# Patient Record
Sex: Male | Born: 1940 | Race: Black or African American | Hispanic: No | Marital: Married | Smoking: Former smoker
Health system: Southern US, Community
[De-identification: ages and names within clinical notes are randomized; demographics above are authoritative.]

## PROBLEM LIST (undated history)

## (undated) DIAGNOSIS — R569 Unspecified convulsions: Secondary | ICD-10-CM

## (undated) DIAGNOSIS — R413 Other amnesia: Secondary | ICD-10-CM

## (undated) DIAGNOSIS — C61 Malignant neoplasm of prostate: Secondary | ICD-10-CM

## (undated) DIAGNOSIS — G4700 Insomnia, unspecified: Secondary | ICD-10-CM

## (undated) DIAGNOSIS — I639 Cerebral infarction, unspecified: Secondary | ICD-10-CM

## (undated) DIAGNOSIS — R269 Unspecified abnormalities of gait and mobility: Secondary | ICD-10-CM

## (undated) DIAGNOSIS — D496 Neoplasm of unspecified behavior of brain: Secondary | ICD-10-CM

## (undated) DIAGNOSIS — E7800 Pure hypercholesterolemia, unspecified: Secondary | ICD-10-CM

## (undated) DIAGNOSIS — C189 Malignant neoplasm of colon, unspecified: Secondary | ICD-10-CM

## (undated) DIAGNOSIS — F0390 Unspecified dementia without behavioral disturbance: Secondary | ICD-10-CM

## (undated) HISTORY — DX: Malignant neoplasm of prostate: C61

## (undated) HISTORY — PX: FOOT SURGERY: SHX648

## (undated) HISTORY — DX: Other amnesia: R41.3

## (undated) HISTORY — DX: Malignant neoplasm of colon, unspecified: C18.9

## (undated) HISTORY — PX: INGUINAL HERNIA REPAIR: SUR1180

## (undated) HISTORY — DX: Unspecified abnormalities of gait and mobility: R26.9

## (undated) HISTORY — DX: Unspecified dementia, unspecified severity, without behavioral disturbance, psychotic disturbance, mood disturbance, and anxiety: F03.90

## (undated) HISTORY — PX: OTHER SURGICAL HISTORY: SHX169

## (undated) HISTORY — DX: Cerebral infarction, unspecified: I63.9

## (undated) HISTORY — DX: Insomnia, unspecified: G47.00

## (undated) HISTORY — DX: Pure hypercholesterolemia, unspecified: E78.00

## (undated) HISTORY — DX: Unspecified convulsions: R56.9

---

## 2002-05-10 ENCOUNTER — Encounter: Payer: Self-pay | Admitting: Family Medicine

## 2002-05-10 ENCOUNTER — Ambulatory Visit (HOSPITAL_COMMUNITY): Admission: RE | Admit: 2002-05-10 | Discharge: 2002-05-10 | Payer: Self-pay | Admitting: Family Medicine

## 2002-05-28 ENCOUNTER — Encounter: Payer: Self-pay | Admitting: Family Medicine

## 2002-05-28 ENCOUNTER — Ambulatory Visit (HOSPITAL_COMMUNITY): Admission: RE | Admit: 2002-05-28 | Discharge: 2002-05-28 | Payer: Self-pay | Admitting: Family Medicine

## 2002-08-26 ENCOUNTER — Inpatient Hospital Stay (HOSPITAL_COMMUNITY): Admission: RE | Admit: 2002-08-26 | Discharge: 2002-08-30 | Payer: Self-pay | Admitting: Neurosurgery

## 2002-08-27 ENCOUNTER — Encounter: Payer: Self-pay | Admitting: Neurosurgery

## 2002-09-03 ENCOUNTER — Ambulatory Visit (HOSPITAL_COMMUNITY): Admission: RE | Admit: 2002-09-03 | Discharge: 2002-09-03 | Payer: Self-pay | Admitting: Neurosurgery

## 2002-09-03 ENCOUNTER — Encounter: Payer: Self-pay | Admitting: Neurosurgery

## 2002-09-15 ENCOUNTER — Encounter: Payer: Self-pay | Admitting: Neurosurgery

## 2002-09-15 ENCOUNTER — Ambulatory Visit (HOSPITAL_COMMUNITY): Admission: RE | Admit: 2002-09-15 | Discharge: 2002-09-15 | Payer: Self-pay | Admitting: Neurosurgery

## 2002-10-01 ENCOUNTER — Ambulatory Visit (HOSPITAL_COMMUNITY): Admission: RE | Admit: 2002-10-01 | Discharge: 2002-10-01 | Payer: Self-pay | Admitting: Neurosurgery

## 2002-10-01 ENCOUNTER — Encounter: Payer: Self-pay | Admitting: Neurosurgery

## 2002-10-21 ENCOUNTER — Encounter: Payer: Self-pay | Admitting: Neurosurgery

## 2002-10-21 ENCOUNTER — Ambulatory Visit (HOSPITAL_COMMUNITY): Admission: RE | Admit: 2002-10-21 | Discharge: 2002-10-21 | Payer: Self-pay | Admitting: Neurosurgery

## 2002-11-29 ENCOUNTER — Other Ambulatory Visit: Admission: RE | Admit: 2002-11-29 | Discharge: 2002-11-29 | Payer: Self-pay | Admitting: Urology

## 2003-01-11 ENCOUNTER — Ambulatory Visit (HOSPITAL_COMMUNITY): Admission: RE | Admit: 2003-01-11 | Discharge: 2003-01-11 | Payer: Self-pay | Admitting: Neurosurgery

## 2003-01-11 ENCOUNTER — Encounter: Payer: Self-pay | Admitting: Neurosurgery

## 2003-02-17 ENCOUNTER — Ambulatory Visit (HOSPITAL_COMMUNITY): Admission: RE | Admit: 2003-02-17 | Discharge: 2003-02-17 | Payer: Self-pay | Admitting: Internal Medicine

## 2003-02-28 ENCOUNTER — Inpatient Hospital Stay (HOSPITAL_COMMUNITY): Admission: AD | Admit: 2003-02-28 | Discharge: 2003-03-08 | Payer: Self-pay | Admitting: General Surgery

## 2003-04-08 ENCOUNTER — Encounter (HOSPITAL_COMMUNITY): Admission: RE | Admit: 2003-04-08 | Discharge: 2003-05-08 | Payer: Self-pay | Admitting: Oncology

## 2003-04-08 ENCOUNTER — Encounter: Admission: RE | Admit: 2003-04-08 | Discharge: 2003-04-08 | Payer: Self-pay | Admitting: Oncology

## 2003-12-19 ENCOUNTER — Encounter (HOSPITAL_COMMUNITY): Admission: RE | Admit: 2003-12-19 | Discharge: 2004-01-18 | Payer: Self-pay | Admitting: Oncology

## 2003-12-19 ENCOUNTER — Encounter: Admission: RE | Admit: 2003-12-19 | Discharge: 2003-12-19 | Payer: Self-pay | Admitting: Oncology

## 2004-01-17 ENCOUNTER — Ambulatory Visit (HOSPITAL_COMMUNITY): Admission: RE | Admit: 2004-01-17 | Discharge: 2004-01-17 | Payer: Self-pay | Admitting: Neurosurgery

## 2004-06-28 ENCOUNTER — Encounter: Admission: RE | Admit: 2004-06-28 | Discharge: 2004-06-28 | Payer: Self-pay | Admitting: Oncology

## 2004-06-28 ENCOUNTER — Ambulatory Visit (HOSPITAL_COMMUNITY): Payer: Self-pay | Admitting: Oncology

## 2004-06-28 ENCOUNTER — Encounter (HOSPITAL_COMMUNITY): Admission: RE | Admit: 2004-06-28 | Discharge: 2004-07-28 | Payer: Self-pay | Admitting: Oncology

## 2004-11-13 ENCOUNTER — Ambulatory Visit (HOSPITAL_COMMUNITY): Admission: RE | Admit: 2004-11-13 | Discharge: 2004-11-13 | Payer: Self-pay | Admitting: Family Medicine

## 2005-01-08 ENCOUNTER — Encounter (HOSPITAL_COMMUNITY): Admission: RE | Admit: 2005-01-08 | Discharge: 2005-02-02 | Payer: Self-pay

## 2005-01-08 ENCOUNTER — Ambulatory Visit (HOSPITAL_COMMUNITY): Payer: Self-pay | Admitting: Oncology

## 2005-01-08 ENCOUNTER — Encounter: Admission: RE | Admit: 2005-01-08 | Discharge: 2005-02-02 | Payer: Self-pay | Admitting: Oncology

## 2005-04-19 ENCOUNTER — Ambulatory Visit (HOSPITAL_COMMUNITY): Payer: Self-pay | Admitting: Oncology

## 2005-04-19 ENCOUNTER — Encounter: Admission: RE | Admit: 2005-04-19 | Discharge: 2005-04-19 | Payer: Self-pay | Admitting: Oncology

## 2005-04-19 ENCOUNTER — Encounter (HOSPITAL_COMMUNITY): Admission: RE | Admit: 2005-04-19 | Discharge: 2005-04-19 | Payer: Self-pay | Admitting: Oncology

## 2005-07-17 ENCOUNTER — Encounter: Admission: RE | Admit: 2005-07-17 | Discharge: 2005-07-17 | Payer: Self-pay | Admitting: Oncology

## 2005-07-17 ENCOUNTER — Ambulatory Visit (HOSPITAL_COMMUNITY): Payer: Self-pay | Admitting: Oncology

## 2005-07-17 ENCOUNTER — Encounter (HOSPITAL_COMMUNITY): Admission: RE | Admit: 2005-07-17 | Discharge: 2005-08-16 | Payer: Self-pay | Admitting: Oncology

## 2005-08-30 ENCOUNTER — Ambulatory Visit (HOSPITAL_COMMUNITY): Admission: RE | Admit: 2005-08-30 | Discharge: 2005-08-30 | Payer: Self-pay | Admitting: Neurosurgery

## 2005-09-18 ENCOUNTER — Ambulatory Visit (HOSPITAL_COMMUNITY): Admission: RE | Admit: 2005-09-18 | Discharge: 2005-09-18 | Payer: Self-pay | Admitting: Internal Medicine

## 2005-09-18 ENCOUNTER — Ambulatory Visit: Payer: Self-pay | Admitting: Internal Medicine

## 2005-09-18 ENCOUNTER — Encounter (INDEPENDENT_AMBULATORY_CARE_PROVIDER_SITE_OTHER): Payer: Self-pay | Admitting: *Deleted

## 2005-10-16 ENCOUNTER — Ambulatory Visit (HOSPITAL_COMMUNITY): Admission: RE | Admit: 2005-10-16 | Discharge: 2005-10-16 | Payer: Self-pay | Admitting: Family Medicine

## 2005-11-01 ENCOUNTER — Ambulatory Visit (HOSPITAL_COMMUNITY): Admission: RE | Admit: 2005-11-01 | Discharge: 2005-11-01 | Payer: Self-pay | Admitting: Neurosurgery

## 2005-11-05 ENCOUNTER — Encounter: Admission: RE | Admit: 2005-11-05 | Discharge: 2005-11-05 | Payer: Self-pay | Admitting: Oncology

## 2005-11-05 ENCOUNTER — Encounter (HOSPITAL_COMMUNITY): Admission: RE | Admit: 2005-11-05 | Discharge: 2005-12-05 | Payer: Self-pay | Admitting: Oncology

## 2005-11-05 ENCOUNTER — Ambulatory Visit (HOSPITAL_COMMUNITY): Payer: Self-pay | Admitting: Oncology

## 2006-01-29 ENCOUNTER — Ambulatory Visit (HOSPITAL_COMMUNITY): Payer: Self-pay | Admitting: Oncology

## 2006-01-29 ENCOUNTER — Encounter (HOSPITAL_COMMUNITY): Admission: RE | Admit: 2006-01-29 | Discharge: 2006-02-01 | Payer: Self-pay | Admitting: Oncology

## 2006-01-29 ENCOUNTER — Encounter: Admission: RE | Admit: 2006-01-29 | Discharge: 2006-02-01 | Payer: Self-pay | Admitting: Oncology

## 2006-04-22 ENCOUNTER — Encounter (HOSPITAL_COMMUNITY): Admission: RE | Admit: 2006-04-22 | Discharge: 2006-05-05 | Payer: Self-pay | Admitting: Oncology

## 2006-04-22 ENCOUNTER — Ambulatory Visit (HOSPITAL_COMMUNITY): Payer: Self-pay | Admitting: Oncology

## 2006-04-25 ENCOUNTER — Ambulatory Visit (HOSPITAL_COMMUNITY): Admission: RE | Admit: 2006-04-25 | Discharge: 2006-04-25 | Payer: Self-pay | Admitting: Neurosurgery

## 2006-06-18 ENCOUNTER — Ambulatory Visit: Payer: Self-pay | Admitting: Gastroenterology

## 2006-08-29 ENCOUNTER — Ambulatory Visit (HOSPITAL_COMMUNITY): Admission: RE | Admit: 2006-08-29 | Discharge: 2006-08-29 | Payer: Self-pay | Admitting: General Surgery

## 2006-10-23 ENCOUNTER — Emergency Department (HOSPITAL_COMMUNITY): Admission: EM | Admit: 2006-10-23 | Discharge: 2006-10-23 | Payer: Self-pay | Admitting: Emergency Medicine

## 2006-10-24 ENCOUNTER — Inpatient Hospital Stay (HOSPITAL_COMMUNITY): Admission: RE | Admit: 2006-10-24 | Discharge: 2006-10-27 | Payer: Self-pay | Admitting: Orthopaedic Surgery

## 2006-11-03 ENCOUNTER — Ambulatory Visit: Payer: Self-pay | Admitting: Orthopedic Surgery

## 2006-11-10 ENCOUNTER — Ambulatory Visit (HOSPITAL_COMMUNITY): Payer: Self-pay | Admitting: Oncology

## 2006-11-10 ENCOUNTER — Encounter (HOSPITAL_COMMUNITY): Admission: RE | Admit: 2006-11-10 | Discharge: 2006-12-10 | Payer: Self-pay | Admitting: Oncology

## 2006-11-20 ENCOUNTER — Encounter (HOSPITAL_COMMUNITY): Admission: RE | Admit: 2006-11-20 | Discharge: 2006-12-20 | Payer: Self-pay | Admitting: Orthopaedic Surgery

## 2006-12-23 ENCOUNTER — Encounter (HOSPITAL_COMMUNITY): Admission: RE | Admit: 2006-12-23 | Discharge: 2007-01-22 | Payer: Self-pay | Admitting: Orthopaedic Surgery

## 2007-01-12 ENCOUNTER — Ambulatory Visit (HOSPITAL_COMMUNITY): Admission: RE | Admit: 2007-01-12 | Discharge: 2007-01-12 | Payer: Self-pay | Admitting: Family Medicine

## 2007-01-23 ENCOUNTER — Encounter (HOSPITAL_COMMUNITY): Admission: RE | Admit: 2007-01-23 | Discharge: 2007-02-03 | Payer: Self-pay | Admitting: Orthopaedic Surgery

## 2007-03-09 IMAGING — CT CT HEAD W/O CM
1 series · 16 of 28 positions shown, 20 images · non-contrast
Comparison: MR of 08/30/05.

CLINICAL DATA: Reevaluate hydrocephalus.  
 HEAD CT WITHOUT CONTRAST ? 11/01/05:
TECHNIQUE: Contiguous axial CT images were obtained from the base of the skull through the vertex according to standard protocol without contrast.

[Series 2: brain · axial · 0.47mm/px · z∈[+174,+303]mm · 16 of 28 slices shown, 20 images]
[im 2/28  brain]
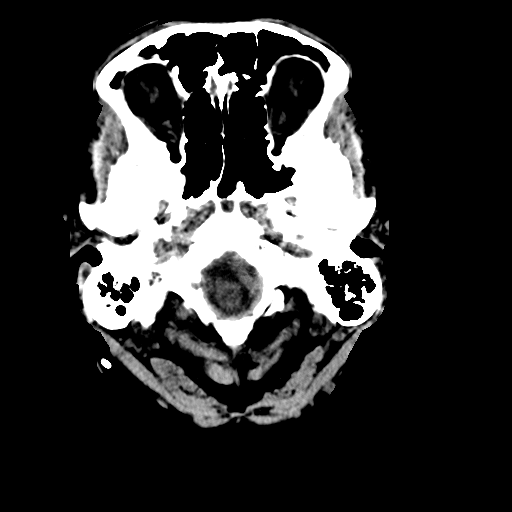
[im 2/28  bone]
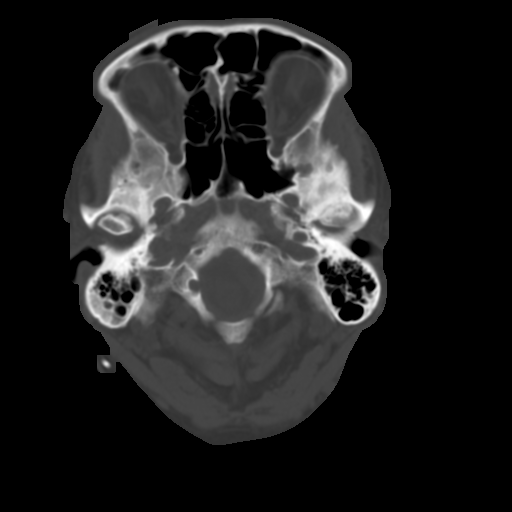
[im 4/28  brain]
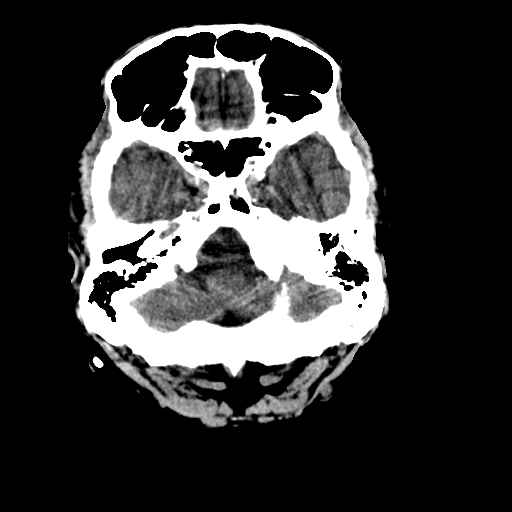
[im 6/28  brain]
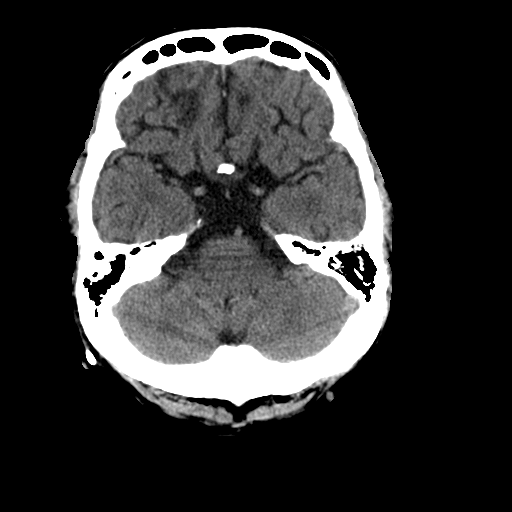
[im 7/28  brain]
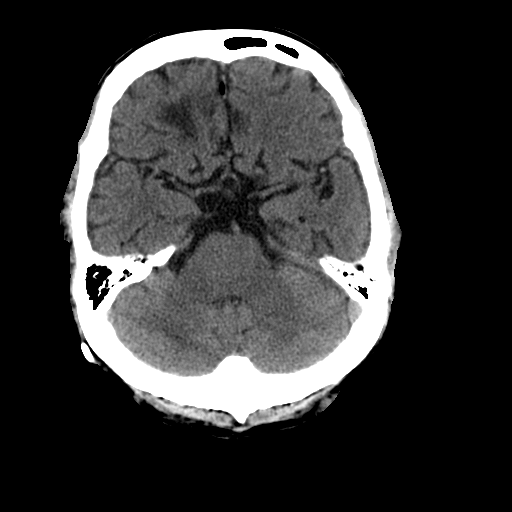
[im 9/28  brain]
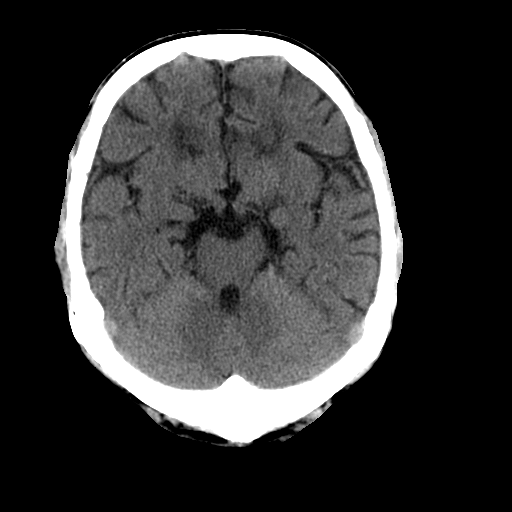
[im 9/28  bone]
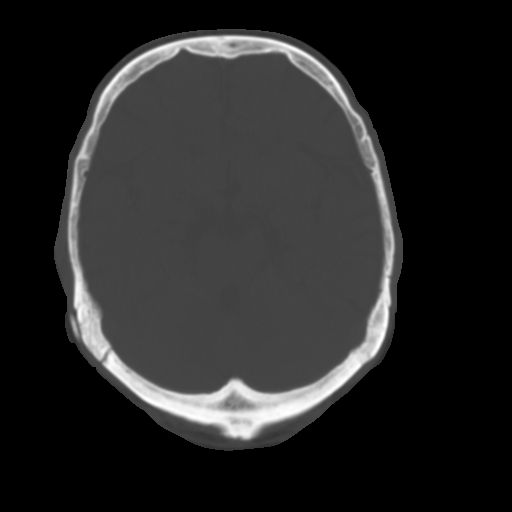
[im 10/28  brain]
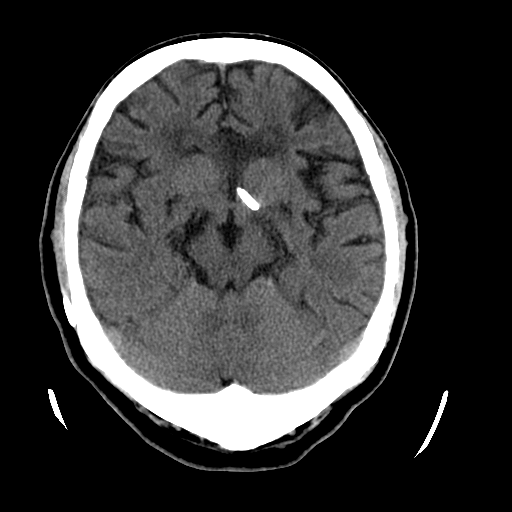
[im 12/28  brain]
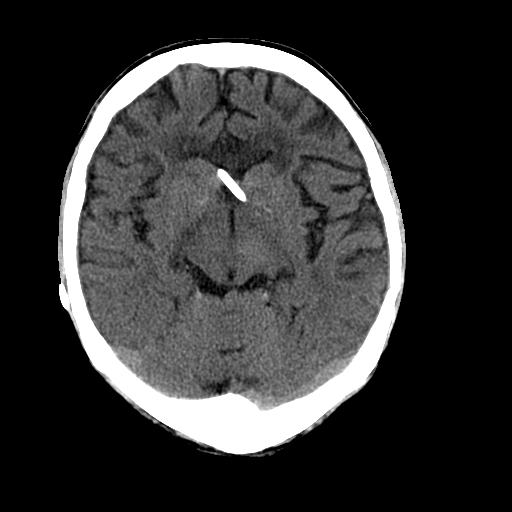
[im 14/28  brain]
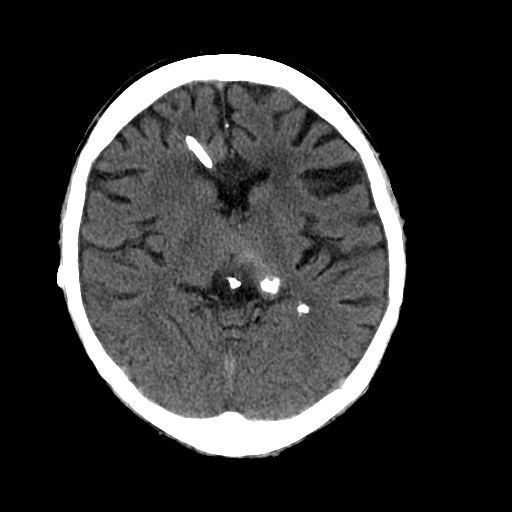
[im 15/28  brain]
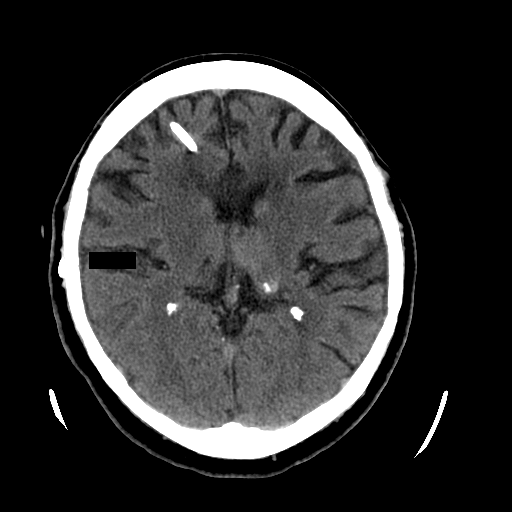
[im 15/28  bone]
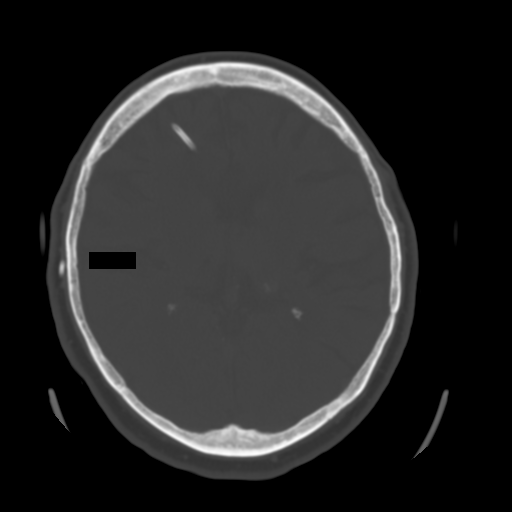
[im 17/28  brain]
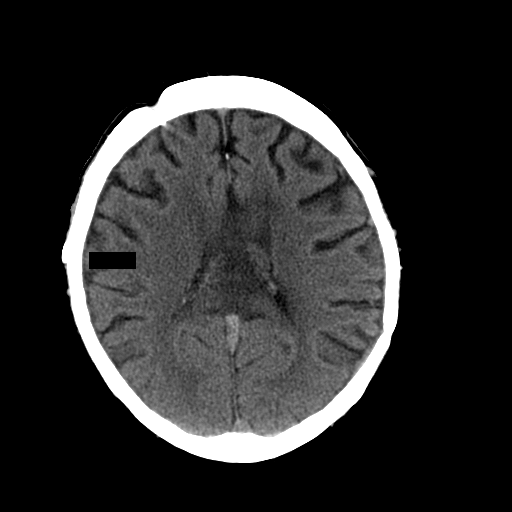
[im 19/28  brain]
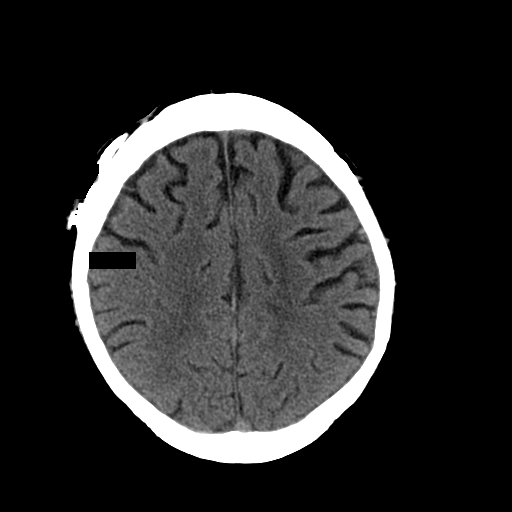
[im 20/28  brain]
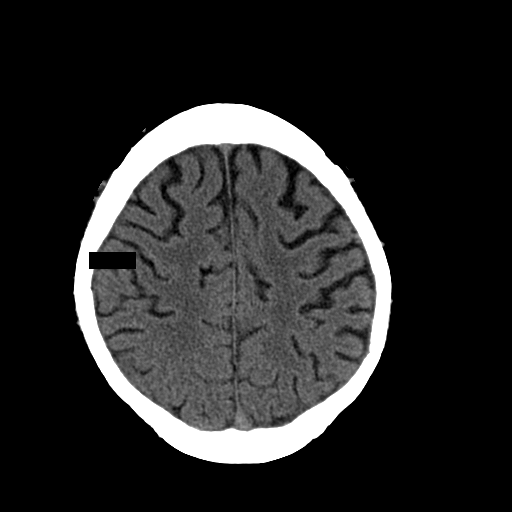
[im 22/28  brain]
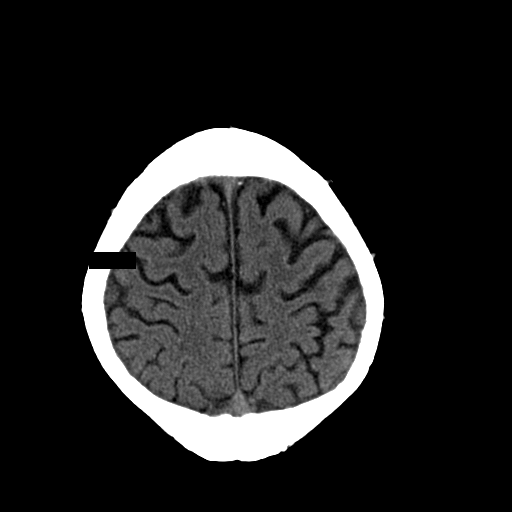
[im 22/28  bone]
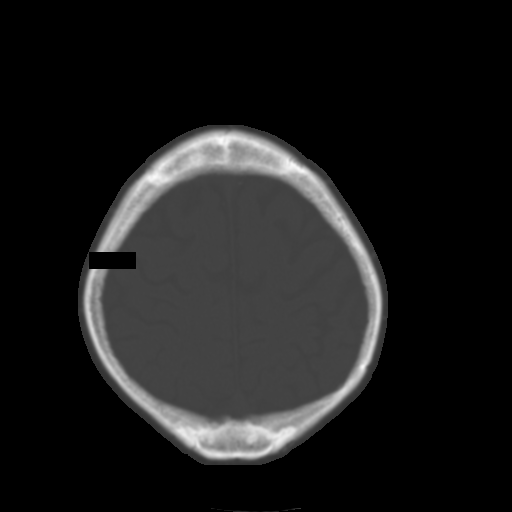
[im 23/28  brain]
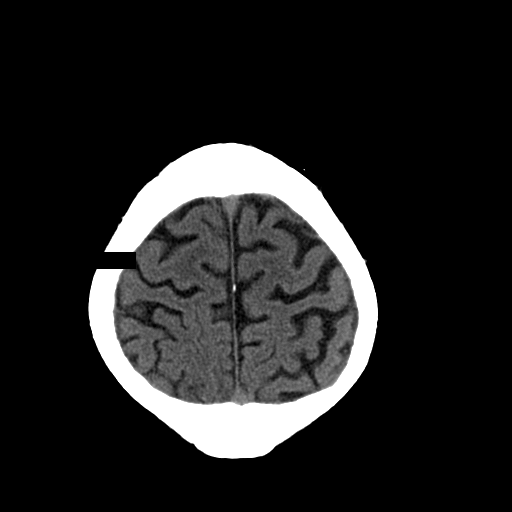
[im 25/28  brain]
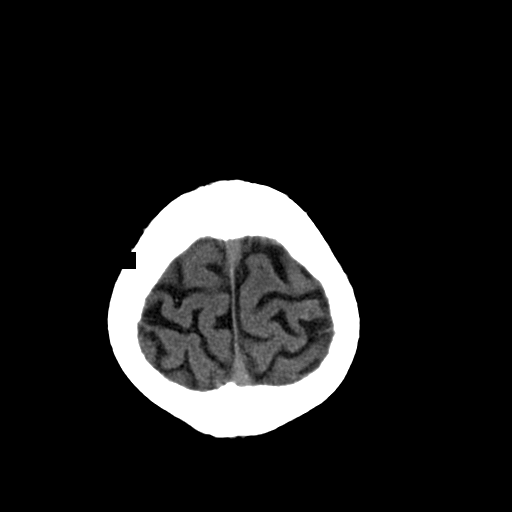
[im 27/28  brain]
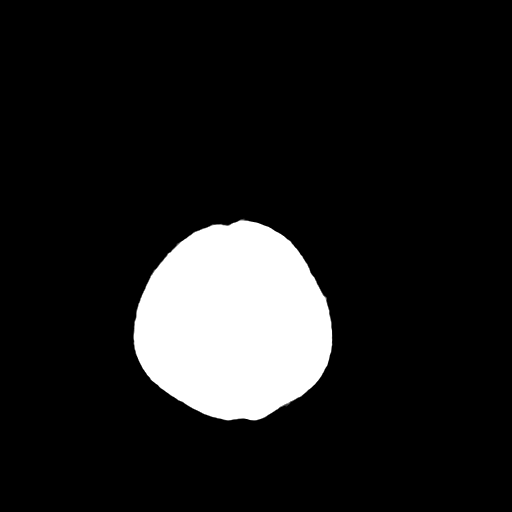

[16 of 28 positions shown; findings below may reference images not displayed]

FINDINGS: A ventricular shunt introduced from a right frontal approach appears to enter the frontal horns of the lateral ventricle and then pass into the inferior basal ganglia region.  Ventricular size shows continued good decompression. Chronic calcification in the left thalamus and cerebral peduncle remains evident.  No sign of increasing mass effect in that region. There continues to be fullness in the region of the genu of the corpus callosum with some low density in the adjacent white matter.
 No extra-axial collection.  No new findings.
IMPRESSION: 1.  Continued good decompression of the ventricles.
 2.  Persistent calcification in the left thalamus and cerebral peduncle and persistent low density throughout the corpus callosum, particular at the genu region.

## 2007-05-13 ENCOUNTER — Ambulatory Visit (HOSPITAL_COMMUNITY): Payer: Self-pay | Admitting: Oncology

## 2007-05-13 ENCOUNTER — Encounter (HOSPITAL_COMMUNITY): Admission: RE | Admit: 2007-05-13 | Discharge: 2007-06-12 | Payer: Self-pay | Admitting: Oncology

## 2007-05-19 ENCOUNTER — Ambulatory Visit (HOSPITAL_COMMUNITY): Admission: RE | Admit: 2007-05-19 | Discharge: 2007-05-19 | Payer: Self-pay | Admitting: Oncology

## 2007-08-05 ENCOUNTER — Encounter (HOSPITAL_COMMUNITY): Admission: RE | Admit: 2007-08-05 | Discharge: 2007-09-04 | Payer: Self-pay | Admitting: Oncology

## 2007-08-05 ENCOUNTER — Ambulatory Visit (HOSPITAL_COMMUNITY): Payer: Self-pay | Admitting: Oncology

## 2007-11-04 ENCOUNTER — Encounter (HOSPITAL_COMMUNITY): Admission: RE | Admit: 2007-11-04 | Discharge: 2007-12-04 | Payer: Self-pay | Admitting: Oncology

## 2007-11-04 ENCOUNTER — Ambulatory Visit (HOSPITAL_COMMUNITY): Payer: Self-pay | Admitting: Oncology

## 2007-12-21 ENCOUNTER — Ambulatory Visit (HOSPITAL_COMMUNITY): Admission: RE | Admit: 2007-12-21 | Discharge: 2007-12-21 | Payer: Self-pay | Admitting: Family Medicine

## 2008-02-10 ENCOUNTER — Encounter (HOSPITAL_COMMUNITY): Admission: RE | Admit: 2008-02-10 | Discharge: 2008-03-11 | Payer: Self-pay | Admitting: Oncology

## 2008-02-10 ENCOUNTER — Ambulatory Visit (HOSPITAL_COMMUNITY): Payer: Self-pay | Admitting: Oncology

## 2008-02-12 ENCOUNTER — Ambulatory Visit (HOSPITAL_COMMUNITY): Admission: RE | Admit: 2008-02-12 | Discharge: 2008-02-12 | Payer: Self-pay | Admitting: Neurosurgery

## 2008-05-11 ENCOUNTER — Encounter (HOSPITAL_COMMUNITY): Admission: RE | Admit: 2008-05-11 | Discharge: 2008-06-10 | Payer: Self-pay | Admitting: Oncology

## 2008-05-11 ENCOUNTER — Ambulatory Visit (HOSPITAL_COMMUNITY): Payer: Self-pay | Admitting: Oncology

## 2008-09-07 ENCOUNTER — Encounter (HOSPITAL_COMMUNITY): Admission: RE | Admit: 2008-09-07 | Discharge: 2008-10-07 | Payer: Self-pay | Admitting: Oncology

## 2008-09-07 ENCOUNTER — Ambulatory Visit (HOSPITAL_COMMUNITY): Payer: Self-pay | Admitting: Oncology

## 2008-11-04 ENCOUNTER — Ambulatory Visit (HOSPITAL_COMMUNITY): Admission: RE | Admit: 2008-11-04 | Discharge: 2008-11-04 | Payer: Self-pay | Admitting: Family Medicine

## 2009-01-11 ENCOUNTER — Ambulatory Visit (HOSPITAL_COMMUNITY): Payer: Self-pay | Admitting: Oncology

## 2009-01-11 ENCOUNTER — Encounter (HOSPITAL_COMMUNITY): Admission: RE | Admit: 2009-01-11 | Discharge: 2009-02-01 | Payer: Self-pay | Admitting: Oncology

## 2009-04-19 ENCOUNTER — Encounter (HOSPITAL_COMMUNITY): Admission: RE | Admit: 2009-04-19 | Discharge: 2009-05-02 | Payer: Self-pay | Admitting: Oncology

## 2009-04-19 ENCOUNTER — Ambulatory Visit (HOSPITAL_COMMUNITY): Payer: Self-pay | Admitting: Oncology

## 2009-08-23 ENCOUNTER — Ambulatory Visit (HOSPITAL_COMMUNITY): Payer: Self-pay | Admitting: Oncology

## 2009-08-23 ENCOUNTER — Encounter (HOSPITAL_COMMUNITY): Admission: RE | Admit: 2009-08-23 | Discharge: 2009-09-22 | Payer: Self-pay | Admitting: Oncology

## 2009-10-30 ENCOUNTER — Ambulatory Visit (HOSPITAL_COMMUNITY): Admission: RE | Admit: 2009-10-30 | Discharge: 2009-10-30 | Payer: Self-pay | Admitting: Neurology

## 2009-12-27 ENCOUNTER — Encounter (HOSPITAL_COMMUNITY): Admission: RE | Admit: 2009-12-27 | Discharge: 2010-01-26 | Payer: Self-pay | Admitting: Oncology

## 2009-12-27 ENCOUNTER — Ambulatory Visit (HOSPITAL_COMMUNITY): Payer: Self-pay | Admitting: Oncology

## 2010-04-25 ENCOUNTER — Ambulatory Visit (HOSPITAL_COMMUNITY): Payer: Self-pay | Admitting: Oncology

## 2010-05-09 ENCOUNTER — Ambulatory Visit (HOSPITAL_COMMUNITY): Admit: 2010-05-09 | Payer: Self-pay | Admitting: Oncology

## 2010-05-14 ENCOUNTER — Encounter (HOSPITAL_COMMUNITY): Admission: RE | Admit: 2010-05-14 | Payer: Self-pay | Source: Home / Self Care | Admitting: Oncology

## 2010-07-19 LAB — CEA: CEA: 1.3 ng/mL (ref 0.0–5.0)

## 2010-07-24 LAB — CEA: CEA: 1.4 ng/mL (ref 0.0–5.0)

## 2010-08-07 LAB — CBC
HCT: 40.4 % (ref 39.0–52.0)
Hemoglobin: 13.6 g/dL (ref 13.0–17.0)
MCHC: 33.7 g/dL (ref 30.0–36.0)
MCV: 90 fL (ref 78.0–100.0)
Platelets: 222 10*3/uL (ref 150–400)
RBC: 4.48 MIL/uL (ref 4.22–5.81)
RDW: 14.5 % (ref 11.5–15.5)
WBC: 4.9 10*3/uL (ref 4.0–10.5)

## 2010-08-07 LAB — COMPREHENSIVE METABOLIC PANEL
ALT: 11 U/L (ref 0–53)
AST: 16 U/L (ref 0–37)
Albumin: 3.7 g/dL (ref 3.5–5.2)
Alkaline Phosphatase: 166 U/L — ABNORMAL HIGH (ref 39–117)
BUN: 4 mg/dL — ABNORMAL LOW (ref 6–23)
CO2: 28 mEq/L (ref 19–32)
Calcium: 8.8 mg/dL (ref 8.4–10.5)
Chloride: 104 mEq/L (ref 96–112)
Creatinine, Ser: 0.7 mg/dL (ref 0.4–1.5)
GFR calc Af Amer: >60 mL/min (ref >60)
GFR calc non Af Amer: >60 mL/min (ref >60)
Glucose, Bld: 92 mg/dL (ref 70–99)
Potassium: 3.6 mEq/L (ref 3.5–5.1)
Sodium: 140 mEq/L (ref 135–145)
Total Bilirubin: 0.5 mg/dL (ref 0.3–1.2)
Total Protein: 6.5 g/dL (ref 6.0–8.3)

## 2010-08-07 LAB — PSA: PSA: 3.23 ng/mL (ref 0.10–4.00)

## 2010-08-07 LAB — CEA: CEA: 0.9 ng/mL (ref 0.0–5.0)

## 2010-08-07 LAB — DIFFERENTIAL
Lymphocytes Relative: 42 % (ref 12–46)
Lymphs Abs: 2.1 10*3/uL (ref 0.7–4.0)
Neutrophils Relative %: 44 % (ref 43–77)

## 2010-08-10 LAB — CEA: CEA: 0.7 ng/mL (ref 0.0–5.0)

## 2010-08-14 LAB — CEA: CEA: <0.5 ng/mL (ref 0.0–5.0)

## 2010-08-14 LAB — PSA: PSA: 9.59 ng/mL — ABNORMAL HIGH (ref 0.10–4.00)

## 2010-08-20 LAB — CBC
HCT: 43.5 % (ref 39.0–52.0)
MCHC: 33.1 g/dL (ref 30.0–36.0)
Platelets: 220 10*3/uL (ref 150–400)
RDW: 14.4 % (ref 11.5–15.5)

## 2010-08-20 LAB — COMPREHENSIVE METABOLIC PANEL
ALT: 17 U/L (ref 0–53)
Calcium: 9 mg/dL (ref 8.4–10.5)
Creatinine, Ser: 0.77 mg/dL (ref 0.4–1.5)
GFR calc Af Amer: >60 mL/min (ref >60)
Glucose, Bld: 91 mg/dL (ref 70–99)
Sodium: 138 mEq/L (ref 135–145)
Total Protein: 7.2 g/dL (ref 6.0–8.3)

## 2010-08-20 LAB — FREE PSA
PSA, Free Pct: 8 % — ABNORMAL LOW (ref >25)
PSA, Free: 0.7 ng/mL

## 2010-08-20 LAB — PSA: PSA: 8.59 ng/mL — ABNORMAL HIGH (ref 0.10–4.00)

## 2010-09-18 NOTE — Discharge Summary (Signed)
[name], Jacob Wagner             ACCOUNT NO.:  [id number]   MEDICAL RECORD NO.:  [id number]          PATIENT TYPE:  INP   LOCATION:  A327                          FACILITY:  [facility]   PHYSICIAN:  J. Darreld Mclean, M.D. DATE OF BIRTH:  07/30/1940   DATE OF ADMISSION:  10/24/2006  DATE OF DISCHARGE:  LH                               DISCHARGE SUMMARY   DISCHARGE DIAGNOSIS:  Trimalleolar fracture of the left ankle.   PROCEDURE PERFORMED:  Open treatment, internal reduction of the left  ankle fracture.   DISCHARGE STATUS:  Improved.   PROGNOSIS:  Good.   DISPOSITION:  Home, to be seen by home health physical therapy.  Toe-  touch weightbearing on the left.   The patient will be seen in my office on November 10, 2006, with x-rays at  that time out of plaster, to remove the staples and put a new cast on.   The patient used a platform walker at home.   DISCHARGE MEDICATIONS:  1. Darvocet-N 100 one every 6 hours p.r.n. pain.  2. Xanax 1 mg p.o. b.i.d.  3. Dilantin 100 mg two in the evening, one in the morning daily.  4. Excedrin one daily.  5. Tylenol as needed.  6. Flexeril 10 mg as needed.   The patient fell and injured his ankle the day before admission.  I saw  him at the office the following day.  He was seen in the emergency room  the day of the injury.   I saw him in the office.  It was obvious he had a displaced fracture and  I recommended surgery.  We originally planned surgery the following day  but he was seen by the anesthesiologist for his preop and felt that it  could be done later that day and be done safely; therefore, he had his  surgery on Friday, the day of admission.  He tolerated it well.  Saturday he was afebrile.  His pain was controlled with a PCA pump.  He  was able to get up out of the bed and make slow progress.  On Saturday  it was obvious that he would need a platform walker.  This was given and  he did much  better.  Family does not want him to go to a  nursing home.  They want to  have home health.  This was arranged.  The patient had physical therapy  prior to discharge.  He is doing well.  He is afebrile, his pain  controlled.   I will see him in the office as stated.  __________ he is to contact me  through the office hospital beeper system.                                            ______________________________  Shela Commons. Darreld Mclean, M.D.     JWK/MEDQ  D:  10/27/2006  T:  10/27/2006  Job:  [id number]

## 2010-09-18 NOTE — Consult Note (Signed)
NAMEALY, Jacob Wagner NO.:  [id number]   MEDICAL RECORD NO.:  [id number]          PATIENT TYPE:  INP   LOCATION:  A327                          FACILITY:  [facility]   PHYSICIAN:  Marcello Moores, MD   DATE OF BIRTH:  [birth year]   DATE OF CONSULTATION:  DATE OF DISCHARGE:                                 CONSULTATION   He was admitted by Dr. Hilda Lias for left ankle fracture for repair.   REASON FOR CONSULTATION:  Medical evaluation and follow-up.   HISTORY OF PRESENT ILLNESS:  Jacob Wagner is a 70 year old African-  American male patient who happened to have left ankle injury yesterday  and had left malleolar fracture and was admitted today and had surgery  by Dr. Hilda Lias and repair, and he is admitted to the third floor.  Currently, he has not any complaints except discomfort on his left  ankle.  Otherwise, he has not any shortness of breath or chest  complaints.  No GI complaints.  No urinary complaints.  He stated that  he is taking at home only Dilantin, Xanax and aspirin.  Otherwise, he is  not taking any other medications, and he denied any history of COPD,  asthma.  He denied history of any diabetes mellitus.  He denied any  history of hypertension or any heart problems.   REVIEW OF SYSTEMS:  A 10-point review of systems is noncontributory  except he had injured yesterday left ankle with fracture ankle  spontaneous.   ALLERGIES:  No known drug allergies.   PAST MEDICAL HISTORY:  Questionable history of seizure.  The patient  mentioned history of seizure 20 years back but I assume that Dilantin  was taken for his brain tumor rather than 20 years' history of seizure.   PAST SURGICAL HISTORY:  1. 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  2. History of colon cancer and prostate cancer status post surgery 3      or 4 years ago as per the patient.  3. Mole removal 5 years ago.  4. Hernia repair few months ago.  5. 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   HOME MEDICATIONS:  1. Dilantin 100 mg in the morning and 200 mg at bedtime.  2. Xanax 1 mg 2 times a day.  3. Excedrin - aspirin.   SOCIAL HISTORY:  He is married and living in [place], and he denied  any smoking, and he denied alcohol or drug abuse.   PHYSICAL EXAMINATION:  He is lying on the bed comfortably.  VITAL SIGNS:  Blood pressure is 123/70 and temperature is 97.8, pulse is  59 and respiratory rate is 16, saturation is 98%.  HEENT:  He has pink conjunctivae.  Nonicteric sclera.  There is surgical  scar on the right parietal area.  On the chest, he has good air entry bilaterally.  CARDIOVASCULAR SYSTEM:  S1-S2 well heard, regular.  ABDOMEN:  Soft.  No area of tenderness.  There is old surgical scar.  EXTREMITIES:  He has not any pitting edema on the right side, and left  ankle area is dressed including the lower leg.  CENTRAL NERVOUS SYSTEM:  He is alert and oriented, and there is not any  sign of neurological deficit.   On the hematology, white blood cells 7, hemoglobin is 13.9, hematocrit  is 40, platelet count is 241.  On the chemistry, sodium is 143,  potassium is 3.4, chloride is 102 and bicarb is 37, glucose is 86, BUN 8  and creatinine 0.4.  Alkaline phosphatase is 165, slightly elevated, and  otherwise, the liver function test is within normal range.  Urinalysis  is basically negative.   ASSESSMENT:  1. Left ankle injury with malleolar fracture status post surgery, and      he is getting morphine for pain, and he is comfortable with that.  2. Benign brain tumor status post shunt which was done 4 years ago,      and he has follow-up and probably is getting Dilantin in relation      with that to prevent seizures, and he was already started at his      home dose by Dr. Hilda Lias and will continue with that, and we will      follow the patient as long as he is staying in the hospital and if      there is any problem also will manage it  accordingly.   Thank you for involving [place] in his management.      Marcello Moores, MD  Electronically Signed     MT/MEDQ  D:  10/24/2006  T:  10/25/2006  Job:  [id number]

## 2010-09-18 NOTE — Op Note (Signed)
Jacob Wagner, Jacob Wagner             ACCOUNT NO.:  [id number]   MEDICAL RECORD NO.:  [id number]          PATIENT TYPE:  INP   LOCATION:  A327                          FACILITY:  [facility]   PHYSICIAN:  J. Darreld Mclean, M.D. DATE OF BIRTH:  1940-09-12   DATE OF PROCEDURE:  DATE OF DISCHARGE:                               OPERATIVE REPORT   PREOPERATIVE DIAGNOSIS:  Trimalleolar fracture of the left ankle.   POSTOPERATIVE DIAGNOSIS:  Trimalleolar fracture of the left ankle.   PROCEDURE:  Open treatment and internal reduction of left trimalleolar  fracture of the ankle, medial malleolus and lateral malleolus.   ANESTHESIA:  Spinal.   TOURNIQUET TIME:  Thirty-nine minutes.   DRAINS:  No drains.   POSTERIOR SPLINT:  Applied.   SURGEON:  J. Darreld Mclean, M.D.   INDICATIONS:  The patient is a 70 year old male who fell in his yard  yesterday and hurt his left ankle and seen in the emergency room.  X-  rays show the fracture.  He was put in a posterior splint and the ER  contacted me and said they would send him to the office this morning.  I  saw him this morning.  I looked at the x-rays and he has displacement.  I took the splint off.  He has marked fracture blisters.  The fracture  is displaced.  He needs surgery.  I initially scheduled him for surgery  tomorrow, Saturday morning, because I saw him in the office morning and  he had already had something to eat.  However, when he came over for his  preop, the anesthesiologist saw the patient and said that it would be  safe to do the surgery after 2 o'clock; the patient was then scheduled  for 2 o'clock today and that was agreeable to the patient.  The risks  and imponderables of the procedure were discussed with the patient  preoperatively and these include infection, traumatic arthritis and  degenerative joint disease, need for limited weightbearing for the next  6-8 weeks, possibility of a blood transfusion, possibility of nerve  injury and anesthesia risks.  The patient is status post cancer of the  brain, surgery and a shunt in his brain; this 4 years ago.  He had  successful surgery 2 months ago for a hernia repair here at the  hospital.  The patient appeared to understand and agreed to the  procedure as outlined.  I also talked on the telephone to his wife.   DESCRIPTION OF PROCEDURE:  The patient seen in the holding area and the  left foot and ankle were identified as the correct surgical site.  A  mark was placed by him and I placed a mark.  He was brought back to the  operating room.  The splint was removed.  He was given a spinal  anesthetic.  He was then placed supine.  Tourniquet placed deflated,  left upper thigh.  He was prepped and draped in the usual manner.  We  had a time-out and the patient was identified and the left ankle was  identified as the correct surgical  site.  The leg was then elevated and  wrapped circumferentially with an Esmarch bandage.  Tourniquet inflated  to 300 mmHg.  Esmarch bandage was removed.  Medial incision was made.  It should be noted he had very large fracture blisters around the ankle.  There were no signs of infection.  With careful dissection, the medial  side of the ankle was opened and the medial malleolus was identified.  He had small fragments of bone within the joint and this was removed and  was irrigated.  No other fragments were seen.  The fracture was then  anatomically reduced and held in place with a Synthes clamp.  A 0.062  smooth Kirschner wire was placed.  A 55-mm-long 4.5-mm malleolar screw  was then inserted; x-rays with the C-arm identified good position and  placement and anatomical reduction medially.  Laterally, the fracture  was identified.  Hematoma was expressed.  Fracture was anatomically held  in place with a clamp and a 6-hole side plate with a locking screw was  inserted.  Screws measured from 16 mm to 18 mm.  The locking screw was   inserted.  The patient had permanent x-rays taken and these looked good.  Wound was reapproximated using 2-0 chromic and 2-0 plain and then  staples for the skin.  Wounds were cleansed with peroxide and large  pieces of Xeroflo attached and placed over the blisters, sterile  dressing applied and a short leg posterior splint applied.  The patient  tolerated the procedure well and went to Recovery in good condition.  He  is admitted.           ______________________________  Shela Commons. Darreld Mclean, M.D.     JWK/MEDQ  D:  10/24/2006  T:  10/25/2006  Job:  [id number]

## 2010-09-18 NOTE — H&P (Signed)
Jacob Wagner, Jacob Wagner             ACCOUNT NO.:  [id number]   MEDICAL RECORD NO.:  [id number]          PATIENT TYPE:  AMB   LOCATION:  DAY                           FACILITY:  [facility]   PHYSICIAN:  J. Darreld Mclean, M.D. DATE OF BIRTH:  Aug 21, 1940   DATE OF ADMISSION:  DATE OF DISCHARGE:  LH                              HISTORY & PHYSICAL   CHIEF COMPLAINT:  I broke my ankle.   The patient is a 70 year old male who fell yesterday and injured his  left ankle.  He was seen in the emergency room.  X-rays showed a  trimalleolar fracture.  He was sent to my office today.  I have reviewed  the x-rays, and he has a displaced trimalleolar fracture in the left  ankle.  I took his dressing off.  He has fracture blisters.  He has a  fracture of the medial malleolus, distal fibula, and a small fragment of  the posterior malleolus.  The fracture is going to need surgical  treatment and reduction.  I have explained to the patient and to a  relative about the findings.   PAST HISTORY:  1. Very significant in that he has had a brain shunt 4 years ago for a      brain tumor.  He has been followed by Dr. Newell Coral for this.  2. He had intestinal surgery 4 years ago by Dr. Katrinka Blazing.  3. He had a mole removed 5 years ago by Dr. Katrinka Blazing.  4. Two months ago, he had hernia surgery by Dr. Lovell Sheehan at [facility].  5. Ulcer disease.  6. Brain tumor.   He is allergic to CIPRO, IBUPROFEN, and VICODIN.   MEDICATIONS:  1. Dilantin 100 mg 3 times a day.  2. Xanax 1 mg 2 times a day.  3. Excedrin as needed.   He does not smoke.  He does not use alcoholic beverages.   Dr. Phillips Odor is his family doctor.  He sees Dr. Newell Coral, Dr. Mariel Sleet,  Dr. Dayton Scrape, Dr. Jerre Simon, and Dr. Lovell Sheehan.  Heart disease runs in the  family.   The patient is married, and he lives in [place].   PHYSICAL EXAMINATION:  VITAL SIGNS:  Blood pressure is 110/78, pulse 68,  respirations 16, afebrile.  He is unable to stand and get on my  scales,  but says he is 6 feet tall and weighs about 200 pounds.  GENERAL:  The patient is alert, cooperative, oriented.  HEENT:  He has an obvious depression in the shunt area on his scalp, but  neurologically he is intact.  NECK:  Supple.  HEART:  Regular rhythm, without murmur heard.  ABDOMEN:  Soft, nontender, without masses.  LUNGS:  Clear.  EXTREMITIES:  He has a deformity of the left lower leg.  He has fracture  blisters once I took his splint off around the ankle, particularly  medially, with ecchymosis, and deformity.  A new posterior splint was  applied.  The other extremities are negative.  CNS:  Intact.  SKIN:  Intact.   IMPRESSION:  1. Displaced trimalleolar fracture left ankle.  2. History of  brain cancer and shunt.  3. History of recent hernia surgery.   PLAN:  Open treatment, internal reduction of the left ankle fracture.  I  discussed with the patient the planned procedure, risk, and  imponderables, including infection, need for hospitalization for several  days after the surgery, use of a walker with limited weightbearing, and  a cast.  I have explained the anesthesia risk, and I would recommend a  spinal anesthesia, but I will defer to the anesthesiologist on this.  His labs are pending.  I plan to do the surgery tomorrow morning.  However, surgery has called and said that the schedule may allow him to  be done later today if the anesthesiologist feels that would be  appropriate.  Again, I will leave it to the discretion of the  anesthesiologist.                                            ______________________________  J. Darreld Mclean, M.D.     JWK/MEDQ  D:  10/24/2006  T:  10/24/2006  Job:  [id number]

## 2011-01-23 LAB — COMPREHENSIVE METABOLIC PANEL
ALT: 18
AST: 20
Albumin: 3.6
Alkaline Phosphatase: 188 — ABNORMAL HIGH
Chloride: 108
GFR calc Af Amer: >60
Potassium: 3.6
Sodium: 141
Total Bilirubin: 0.7
Total Protein: 6.8

## 2011-01-23 LAB — CBC
MCV: 87.1
Platelets: 226
RBC: 4.9
WBC: 5.1

## 2011-01-23 LAB — CEA: CEA: 0.6

## 2011-01-31 LAB — PSA: PSA: 9.08 — ABNORMAL HIGH

## 2011-02-04 LAB — CEA: CEA: 0.8

## 2011-02-04 LAB — PSA: PSA: 8.38 — ABNORMAL HIGH

## 2011-02-19 LAB — COMPREHENSIVE METABOLIC PANEL
ALT: 14
AST: 18
Albumin: 3.1 — ABNORMAL LOW
CO2: 28
Calcium: 9.1
Chloride: 106
Creatinine, Ser: 0.85
GFR calc Af Amer: >60
GFR calc non Af Amer: >60
Sodium: 140
Total Bilirubin: 0.5

## 2011-02-19 LAB — CBC
Hemoglobin: 13.3
MCHC: 33.9
RBC: 4.52

## 2011-02-19 LAB — CEA: CEA: 0.7

## 2011-02-19 LAB — PSA: PSA: 7.6 — ABNORMAL HIGH

## 2011-02-20 LAB — URINALYSIS, ROUTINE W REFLEX MICROSCOPIC
Bilirubin Urine: NEGATIVE
Ketones, ur: NEGATIVE
Leukocytes, UA: NEGATIVE
Nitrite: NEGATIVE
Protein, ur: NEGATIVE
Urobilinogen, UA: 0.2
pH: 5.5

## 2011-02-20 LAB — DIFFERENTIAL
Basophils Absolute: 0
Basophils Absolute: 0
Basophils Relative: 0
Basophils Relative: 0
Blasts: 0
Eosinophils Absolute: 0.1
Eosinophils Relative: 1
Lymphocytes Relative: 16
Lymphocytes Relative: 26
Monocytes Absolute: 0.9 — ABNORMAL HIGH
Myelocytes: 0
Neutro Abs: 6.3
Neutrophils Relative %: 69
Promyelocytes Absolute: 0

## 2011-02-20 LAB — CBC
HCT: 40.3
Hemoglobin: 14.1
MCHC: 35.5
MCV: 86
Platelets: 212
RBC: 4.69
RDW: 14.1 — ABNORMAL HIGH
WBC: 7.1

## 2011-02-20 LAB — BASIC METABOLIC PANEL
BUN: 5 — ABNORMAL LOW
CO2: 35 — ABNORMAL HIGH
Calcium: 9
Creatinine, Ser: 0.62
GFR calc non Af Amer: >60
Glucose, Bld: 112 — ABNORMAL HIGH
Sodium: 143

## 2011-02-20 LAB — COMPREHENSIVE METABOLIC PANEL
AST: 27
Albumin: 3.8
Alkaline Phosphatase: 165 — ABNORMAL HIGH
CO2: 37 — ABNORMAL HIGH
Chloride: 102
Creatinine, Ser: 0.74
GFR calc Af Amer: >60
GFR calc non Af Amer: >60
Potassium: 3.5
Total Bilirubin: 0.9

## 2011-04-18 ENCOUNTER — Telehealth: Payer: Self-pay

## 2011-04-18 NOTE — Telephone Encounter (Signed)
Pt referred for colonoscopy. Has hx of polyps. Had last one 09/18/2005 and next was recommended in 3 years. Called, many rings and no answer. Mailing a letter to call and schedule an OV appt.

## 2011-07-30 ENCOUNTER — Other Ambulatory Visit (HOSPITAL_COMMUNITY): Payer: Self-pay | Admitting: Internal Medicine

## 2011-07-30 DIAGNOSIS — R1084 Generalized abdominal pain: Secondary | ICD-10-CM

## 2011-08-01 ENCOUNTER — Ambulatory Visit (HOSPITAL_COMMUNITY)
Admission: RE | Admit: 2011-08-01 | Discharge: 2011-08-01 | Disposition: A | Discharge status: Home / Self Care | Payer: Medicare Other | Source: Ambulatory Visit | Attending: Internal Medicine | Admitting: Internal Medicine

## 2011-08-01 DIAGNOSIS — R300 Dysuria: Secondary | ICD-10-CM | POA: Insufficient documentation

## 2011-08-01 DIAGNOSIS — Q619 Cystic kidney disease, unspecified: Secondary | ICD-10-CM | POA: Insufficient documentation

## 2011-08-01 DIAGNOSIS — R1084 Generalized abdominal pain: Secondary | ICD-10-CM | POA: Insufficient documentation

## 2011-08-01 MED ORDER — IOHEXOL 300 MG/ML  SOLN
100.0000 mL | Freq: Once | INTRAMUSCULAR | Status: AC | PRN
  Administered 2011-08-01: 100 mL via INTRAVENOUS

## 2011-08-21 ENCOUNTER — Telehealth: Payer: Self-pay

## 2011-08-21 NOTE — Telephone Encounter (Signed)
Called and spoke with pt. He said his wife will need to call me later to day. ( Also spoke to the son). His last colonoscopy was done on 09/18/2005 by NUR. Pt has hx of colon cancer. Needs OV.

## 2011-08-22 NOTE — Telephone Encounter (Signed)
Called and spoke to pt's wife. She asked about Dr. Karilyn Cota. I gave the phone number to her. She said she will call back if he is unable to take care of pt.

## 2011-10-04 ENCOUNTER — Ambulatory Visit (INDEPENDENT_AMBULATORY_CARE_PROVIDER_SITE_OTHER): Payer: Medicare Other | Admitting: Urology

## 2011-10-04 DIAGNOSIS — R3129 Other microscopic hematuria: Secondary | ICD-10-CM

## 2011-10-04 DIAGNOSIS — C61 Malignant neoplasm of prostate: Secondary | ICD-10-CM

## 2011-10-04 DIAGNOSIS — Z87440 Personal history of urinary (tract) infections: Secondary | ICD-10-CM

## 2011-10-16 ENCOUNTER — Encounter (INDEPENDENT_AMBULATORY_CARE_PROVIDER_SITE_OTHER): Payer: Self-pay | Admitting: *Deleted

## 2011-10-17 ENCOUNTER — Ambulatory Visit (HOSPITAL_COMMUNITY)
Admission: RE | Admit: 2011-10-17 | Discharge: 2011-10-17 | Disposition: A | Discharge status: Home / Self Care | Payer: Medicare Other | Source: Ambulatory Visit | Attending: Family Medicine | Admitting: Family Medicine

## 2011-10-17 ENCOUNTER — Other Ambulatory Visit (HOSPITAL_COMMUNITY): Payer: Self-pay | Admitting: Family Medicine

## 2011-10-17 DIAGNOSIS — R05 Cough: Secondary | ICD-10-CM

## 2011-10-17 DIAGNOSIS — Z0000 Encounter for general adult medical examination without abnormal findings: Secondary | ICD-10-CM

## 2011-10-17 DIAGNOSIS — R059 Cough, unspecified: Secondary | ICD-10-CM | POA: Insufficient documentation

## 2012-01-03 ENCOUNTER — Ambulatory Visit (INDEPENDENT_AMBULATORY_CARE_PROVIDER_SITE_OTHER): Payer: Medicare Other | Admitting: Urology

## 2012-01-03 DIAGNOSIS — R3129 Other microscopic hematuria: Secondary | ICD-10-CM

## 2012-01-03 DIAGNOSIS — C61 Malignant neoplasm of prostate: Secondary | ICD-10-CM

## 2012-01-24 ENCOUNTER — Ambulatory Visit (INDEPENDENT_AMBULATORY_CARE_PROVIDER_SITE_OTHER): Payer: Medicare Other | Admitting: Urology

## 2012-01-24 DIAGNOSIS — C61 Malignant neoplasm of prostate: Secondary | ICD-10-CM

## 2012-01-24 DIAGNOSIS — R300 Dysuria: Secondary | ICD-10-CM

## 2012-03-27 ENCOUNTER — Ambulatory Visit (INDEPENDENT_AMBULATORY_CARE_PROVIDER_SITE_OTHER): Payer: Medicare Other | Admitting: Urology

## 2012-03-27 DIAGNOSIS — R300 Dysuria: Secondary | ICD-10-CM

## 2012-03-27 DIAGNOSIS — C61 Malignant neoplasm of prostate: Secondary | ICD-10-CM

## 2012-04-10 ENCOUNTER — Encounter (INDEPENDENT_AMBULATORY_CARE_PROVIDER_SITE_OTHER): Payer: Self-pay | Admitting: *Deleted

## 2012-04-16 ENCOUNTER — Encounter (INDEPENDENT_AMBULATORY_CARE_PROVIDER_SITE_OTHER): Payer: Self-pay | Admitting: Internal Medicine

## 2012-04-16 ENCOUNTER — Ambulatory Visit (INDEPENDENT_AMBULATORY_CARE_PROVIDER_SITE_OTHER): Payer: Medicare Other | Admitting: Internal Medicine

## 2012-04-16 VITALS — BP 122/62 | HR 60 | Temp 97.0°F | Ht 72.0 in | Wt 204.7 lb

## 2012-04-16 DIAGNOSIS — C189 Malignant neoplasm of colon, unspecified: Secondary | ICD-10-CM | POA: Insufficient documentation

## 2012-04-16 DIAGNOSIS — R6881 Early satiety: Secondary | ICD-10-CM | POA: Insufficient documentation

## 2012-04-16 DIAGNOSIS — R634 Abnormal weight loss: Secondary | ICD-10-CM

## 2012-04-16 NOTE — Patient Instructions (Addendum)
EGD/Colonoscopy. CEA today

## 2012-04-16 NOTE — Progress Notes (Addendum)
Subjective:     Patient ID: Jacob Wagner, male   DOB: January 04, 1941, 71 y.o.   MRN: [id number]  HPIReferred to our office for decreased appetite. He will sometimes throw his food in the trash.  Wife says he   Has stopped eating.   His wife says his appetite was good till up to 3 weeks ago. Weight loss of 36 pounds over the past 6 months. Acid reflux is controlled with Protonix. BM every 3 days.  No rectal bleeding or melena. Says foods do not hurt his stomach.  Hx of Prostate Cancer, and ? Brain tumor. 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  2007:Colonoscopy: Biopsy: Hyperplastic polyp x2. No adenomatous change or malignancy identified  07/27/2011 H and H 13.4 and 41.6, MCV 90.6, Platelet ct 257   Review of Systems see hpi Current Outpatient Prescriptions  Medication Sig Dispense Refill  . acetaminophen (TYLENOL) 325 MG tablet Take 650 mg by mouth every 6 (six) hours as needed.      . pantoprazole (PROTONIX) 40 MG tablet Take 40 mg by mouth daily.      . phenytoin (DILANTIN) 100 MG ER capsule Take by mouth 3 (three) times daily.        Past Medical History  Diagnosis Date  . Seizures   . Dementia   . Colon cancer    Past Surgical History  Procedure Date  . Prostate cancer   . Colon surgery 10 yrs ago    Allergies  Allergen Reactions  . Ciprocinonide (Fluocinolone)   . Flomax (Tamsulosin Hcl)   . Levaquin (Levofloxacin In D5w)        Objective:   Physical Exam Filed Vitals:   04/16/12 1023  BP: 122/62  Pulse: 60  Temp: 97 F (36.1 C)  Height: 6' (1.829 m)  Weight: 204 lb 11.2 oz (92.851 kg)  Alert and oriented. Skin warm and dry. Oral mucosa is moist.   . Sclera anicteric, conjunctivae is pink. Thyroid not enlarged. No cervical lymphadenopathy. Lungs clear. Heart regular rate and rhythm.  Abdomen is soft. Bowel sounds are positive. No hepatomegaly. No abdominal masses felt. No  tenderness.  No edema to lower extremities.        Assessment:    Early Satiety, Weight loss. Personal hx of colon cancer. Colonic neoplasm needs to be ruled ou I will wait and place him on Megace after procedures.     Plan:    Colonscopy, EGD, CEA at family's request.

## 2012-04-17 ENCOUNTER — Other Ambulatory Visit (HOSPITAL_COMMUNITY): Payer: Self-pay | Admitting: Family Medicine

## 2012-04-17 DIAGNOSIS — F0280 Dementia in other diseases classified elsewhere without behavioral disturbance: Secondary | ICD-10-CM

## 2012-04-17 DIAGNOSIS — R269 Unspecified abnormalities of gait and mobility: Secondary | ICD-10-CM

## 2012-04-17 LAB — CEA: CEA: 1.1 ng/mL (ref 0.0–5.0)

## 2012-04-20 ENCOUNTER — Encounter (INDEPENDENT_AMBULATORY_CARE_PROVIDER_SITE_OTHER): Payer: Self-pay

## 2012-04-20 ENCOUNTER — Telehealth (INDEPENDENT_AMBULATORY_CARE_PROVIDER_SITE_OTHER): Payer: Self-pay | Admitting: *Deleted

## 2012-04-20 NOTE — Telephone Encounter (Signed)
Patient was seen 12/12, you wanted him sch'd for TCS/EGD, patient's daughter said they would call back to schedule, she called back today to let us know he wouldn't be having TCS/EGD at this time, they are having to do a CT head on him this week and they will call us at later date for TCS/EGD

## 2012-04-21 NOTE — Telephone Encounter (Signed)
Reviewed

## 2012-04-22 ENCOUNTER — Ambulatory Visit (HOSPITAL_COMMUNITY)
Admission: RE | Admit: 2012-04-22 | Discharge: 2012-04-22 | Disposition: A | Discharge status: Home / Self Care | Payer: Medicare Other | Source: Ambulatory Visit | Attending: Family Medicine | Admitting: Family Medicine

## 2012-04-22 DIAGNOSIS — G309 Alzheimer's disease, unspecified: Secondary | ICD-10-CM

## 2012-04-22 DIAGNOSIS — R269 Unspecified abnormalities of gait and mobility: Secondary | ICD-10-CM | POA: Insufficient documentation

## 2012-04-22 DIAGNOSIS — F0390 Unspecified dementia without behavioral disturbance: Secondary | ICD-10-CM | POA: Insufficient documentation

## 2012-04-22 LAB — POCT I-STAT, CHEM 8
BUN: 21 mg/dL (ref 6–23)
Chloride: 105 mEq/L (ref 96–112)
HCT: 43 % (ref 39.0–52.0)
Potassium: 5.5 mEq/L — ABNORMAL HIGH (ref 3.5–5.1)

## 2012-04-22 MED ORDER — IOHEXOL 300 MG/ML  SOLN
80.0000 mL | Freq: Once | INTRAMUSCULAR | Status: AC | PRN
  Administered 2012-04-22: 80 mL via INTRAVENOUS

## 2012-04-22 NOTE — Progress Notes (Signed)
Blood sample obtained from right hand IV for creatnine level.

## 2013-02-26 ENCOUNTER — Ambulatory Visit: Payer: Medicare Other | Admitting: Urology

## 2013-03-22 ENCOUNTER — Encounter (HOSPITAL_COMMUNITY): Payer: Self-pay | Admitting: Emergency Medicine

## 2013-03-22 ENCOUNTER — Inpatient Hospital Stay (HOSPITAL_COMMUNITY)
Admission: EM | Admit: 2013-03-22 | Discharge: 2013-03-24 | DRG: 557 | Disposition: A | Discharge status: Home Health | Payer: Medicare Other | Attending: Internal Medicine | Admitting: Internal Medicine

## 2013-03-22 ENCOUNTER — Emergency Department (HOSPITAL_COMMUNITY): Payer: Medicare Other

## 2013-03-22 DIAGNOSIS — M6282 Rhabdomyolysis: Principal | ICD-10-CM

## 2013-03-22 DIAGNOSIS — C189 Malignant neoplasm of colon, unspecified: Secondary | ICD-10-CM | POA: Diagnosis present

## 2013-03-22 DIAGNOSIS — W19XXXA Unspecified fall, initial encounter: Secondary | ICD-10-CM

## 2013-03-22 DIAGNOSIS — F0390 Unspecified dementia without behavioral disturbance: Secondary | ICD-10-CM

## 2013-03-22 DIAGNOSIS — F0391 Unspecified dementia with behavioral disturbance: Secondary | ICD-10-CM | POA: Diagnosis present

## 2013-03-22 DIAGNOSIS — R51 Headache: Secondary | ICD-10-CM | POA: Diagnosis present

## 2013-03-22 DIAGNOSIS — G92 Toxic encephalopathy: Secondary | ICD-10-CM | POA: Diagnosis present

## 2013-03-22 DIAGNOSIS — R269 Unspecified abnormalities of gait and mobility: Secondary | ICD-10-CM | POA: Diagnosis present

## 2013-03-22 DIAGNOSIS — F17200 Nicotine dependence, unspecified, uncomplicated: Secondary | ICD-10-CM | POA: Diagnosis present

## 2013-03-22 DIAGNOSIS — R55 Syncope and collapse: Secondary | ICD-10-CM

## 2013-03-22 DIAGNOSIS — F03918 Unspecified dementia, unspecified severity, with other behavioral disturbance: Secondary | ICD-10-CM | POA: Diagnosis present

## 2013-03-22 DIAGNOSIS — Y92009 Unspecified place in unspecified non-institutional (private) residence as the place of occurrence of the external cause: Secondary | ICD-10-CM

## 2013-03-22 DIAGNOSIS — G40909 Epilepsy, unspecified, not intractable, without status epilepticus: Secondary | ICD-10-CM

## 2013-03-22 DIAGNOSIS — Z85038 Personal history of other malignant neoplasm of large intestine: Secondary | ICD-10-CM

## 2013-03-22 DIAGNOSIS — M25519 Pain in unspecified shoulder: Secondary | ICD-10-CM | POA: Diagnosis present

## 2013-03-22 DIAGNOSIS — R531 Weakness: Secondary | ICD-10-CM

## 2013-03-22 DIAGNOSIS — Z8546 Personal history of malignant neoplasm of prostate: Secondary | ICD-10-CM

## 2013-03-22 DIAGNOSIS — T43505A Adverse effect of unspecified antipsychotics and neuroleptics, initial encounter: Secondary | ICD-10-CM | POA: Diagnosis present

## 2013-03-22 DIAGNOSIS — G9340 Encephalopathy, unspecified: Secondary | ICD-10-CM

## 2013-03-22 DIAGNOSIS — Z982 Presence of cerebrospinal fluid drainage device: Secondary | ICD-10-CM

## 2013-03-22 DIAGNOSIS — G929 Unspecified toxic encephalopathy: Secondary | ICD-10-CM | POA: Diagnosis present

## 2013-03-22 HISTORY — DX: Neoplasm of unspecified behavior of brain: D49.6

## 2013-03-22 LAB — CBC WITH DIFFERENTIAL/PLATELET
Eosinophils Relative: 0 % (ref 0–5)
HCT: 41.5 % (ref 39.0–52.0)
Hemoglobin: 13.9 g/dL (ref 13.0–17.0)
Lymphocytes Relative: 9 % — ABNORMAL LOW (ref 12–46)
Lymphs Abs: 0.8 10*3/uL (ref 0.7–4.0)
MCV: 88.7 fL (ref 78.0–100.0)
Monocytes Absolute: 0.8 10*3/uL (ref 0.1–1.0)
Monocytes Relative: 10 % (ref 3–12)
Neutro Abs: 6.7 10*3/uL (ref 1.7–7.7)
WBC: 8.3 10*3/uL (ref 4.0–10.5)

## 2013-03-22 LAB — PHENYTOIN LEVEL, TOTAL: Phenytoin Lvl: 12.9 ug/mL (ref 10.0–20.0)

## 2013-03-22 LAB — COMPREHENSIVE METABOLIC PANEL
AST: 38 U/L — ABNORMAL HIGH (ref 0–37)
BUN: 8 mg/dL (ref 6–23)
CO2: 31 mEq/L (ref 19–32)
Chloride: 100 mEq/L (ref 96–112)
Creatinine, Ser: 0.9 mg/dL (ref 0.50–1.35)
GFR calc Af Amer: >90 mL/min (ref >90)
GFR calc non Af Amer: 83 mL/min — ABNORMAL LOW (ref >90)
Glucose, Bld: 98 mg/dL (ref 70–99)
Total Bilirubin: 0.6 mg/dL (ref 0.3–1.2)

## 2013-03-22 LAB — URINALYSIS, ROUTINE W REFLEX MICROSCOPIC
Glucose, UA: NEGATIVE mg/dL
Leukocytes, UA: NEGATIVE
Nitrite: NEGATIVE
pH: 8 (ref 5.0–8.0)

## 2013-03-22 LAB — CK: Total CK: 2812 U/L — ABNORMAL HIGH (ref 7–232)

## 2013-03-22 MED ORDER — ACETAMINOPHEN 650 MG RE SUPP: 650.0000 mg | Freq: Four times a day (QID) | RECTAL | Status: DC | PRN

## 2013-03-22 MED ORDER — ONDANSETRON HCL 4 MG PO TABS: 4.0000 mg | ORAL_TABLET | Freq: Four times a day (QID) | ORAL | Status: DC | PRN

## 2013-03-22 MED ORDER — PHENYTOIN SODIUM 50 MG/ML IJ SOLN
INTRAMUSCULAR | Status: AC
  Filled 2013-03-22: qty 4

## 2013-03-22 MED ORDER — ACETAMINOPHEN 325 MG PO TABS: 650.0000 mg | ORAL_TABLET | Freq: Four times a day (QID) | ORAL | Status: DC | PRN

## 2013-03-22 MED ORDER — SODIUM CHLORIDE 0.45 % IV SOLN: INTRAVENOUS | Status: DC

## 2013-03-22 MED ORDER — ONDANSETRON HCL 4 MG/2ML IJ SOLN: 4.0000 mg | Freq: Four times a day (QID) | INTRAMUSCULAR | Status: DC | PRN

## 2013-03-22 MED ORDER — SODIUM CHLORIDE 0.9 % IV SOLN
200.0000 mg | Freq: Every day | INTRAVENOUS | Status: DC
  Administered 2013-03-22: 200 mg via INTRAVENOUS
  Filled 2013-03-22 (×2): qty 4

## 2013-03-22 MED ORDER — PHENYTOIN SODIUM EXTENDED 100 MG PO CAPS: 200.0000 mg | ORAL_CAPSULE | Freq: Every day | ORAL | Status: DC

## 2013-03-22 MED ORDER — SODIUM CHLORIDE 0.9 % IV SOLN
INTRAVENOUS | Status: DC
  Administered 2013-03-22 – 2013-03-24 (×4): via INTRAVENOUS

## 2013-03-22 MED ORDER — PHENYTOIN SODIUM 50 MG/ML IJ SOLN
100.0000 mg | Freq: Every day | INTRAMUSCULAR | Status: DC
  Administered 2013-03-23: 100 mg via INTRAVENOUS
  Filled 2013-03-22: qty 2

## 2013-03-22 MED ORDER — PHENYTOIN SODIUM EXTENDED 100 MG PO CAPS: 100.0000 mg | ORAL_CAPSULE | Freq: Every day | ORAL | Status: DC

## 2013-03-22 NOTE — ED Notes (Signed)
Per EMS, pt was found lying in floor at his home. Wife lives in same house but unsure when patient fell. Complain of pain in right shoulder

## 2013-03-22 NOTE — ED Notes (Signed)
Wife called to notify staff that pt is not to have an MRI due to having a shut to the head due to hydrocephalus. This information was added to the history and under allergies to alert staff.

## 2013-03-22 NOTE — ED Notes (Signed)
Report called to [place], RN on unit 300.

## 2013-03-22 NOTE — ED Notes (Signed)
Patient's wife states he has cranial shunt and cannot be put in MRI.

## 2013-03-22 NOTE — ED Notes (Signed)
Updated patient's wife on current condition.

## 2013-03-22 NOTE — H&P (Signed)
History and Physical  Jacob Wagner YQM:[id number] DOB: 1940-10-07 DOA: 03/22/2013  Referring physician: Rolland Porter, MD in ED PCP: [name], MD   Chief Complaint: Fall at home  HPI:  72 year old male with history of dementia, seizure disorder, VP shunt placement who was found down on the floor for an unknown period of time by his family. Initial evaluation in the emergency department was notable for gait instability and rhabdomyolysis the patient was referred for admission.  History is very limited. Patient can provide no history. Obtained from daughter at bedside and wife by telephone. The patient has a complex medical history which is not clearly defined and there is no information in Epic. 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 It is not clear whether he received any treatment for this. The family denies that he ever had any resection. The family reports that he has progressive dementia with agitation worsening. He wanders and the family has to lock him in the room at night. He requires assistance with all ADLs with the exception of eating. He does have increasing agitation his primary care physician started him on Zyprexa 3 days ago. Sometime last night the patient fell. This morning his wife found him laying on the floor. It is unknown how long he had been laying there. He remained somewhat sleepy so he was brought to the emergency department. His history of seizure disorder but has not had a seizure in 20 years. There are no signs or symptoms to suggest infection but the family is aware of.  In the emergency department and be afebrile with stable vital signs. Complete metabolic panel notable for elevated alkaline phosphatase. AST mildly elevated at 38. Total CK 2012. Troponin negative. CBC unremarkable. Phenytoin level normal. Urinalysis  unremarkable. CT of the head and not acute. EKG nonacute. Forearm and shoulder films unremarkable.  Review of Systems:  History very limited. Per family Negative for fever, visual changes, sore throat, rash, new muscle aches, chest pain, SOB, dysuria, bleeding, n/v/abdominal pain.  Past Medical History  Diagnosis Date  . Seizures   . Dementia   . Colon cancer   . Brain tumor     Past Surgical History  Procedure Laterality Date  . Prostate cancer    . Colon surgery 10 yrs ago    . Shunt to head    . Foot surgery      Social History:  reports that he has quit smoking. He does not have any smokeless tobacco history on file. He reports that he does not drink alcohol or use illicit drugs.  Allergies  Allergen Reactions  . Ciprocinonide [Fluocinolone]   . Flomax [Tamsulosin Hcl]   . Levaquin [Levofloxacin In D5w]   . Other     Wife states NO MRI due to shunt in pts head.     Family History  Problem Relation Age of Onset  . Cancer Father     lung  . Dementia Mother      Prior to Admission medications   Medication Sig Start Date End Date Taking? Authorizing Provider  acetaminophen (TYLENOL) 325 MG tablet Take 650 mg by mouth every 6 (six) hours as needed.    Historical Provider, MD  atorvastatin (LIPITOR) 10 MG tablet  01/02/13   Historical Provider, MD  OLANZapine (ZYPREXA) 15 MG tablet  03/18/13   Historical  Provider, MD  pantoprazole (PROTONIX) 40 MG tablet Take 40 mg by mouth daily.    Historical Provider, MD  phenytoin (DILANTIN) 100 MG ER capsule Take by mouth 3 (three) times daily.    Historical Provider, MD   Physical Exam: Filed Vitals:   03/22/13 1345 03/22/13 1400 03/22/13 1500 03/22/13 1600  BP:  73/16 116/74 109/69  Pulse: 73 66 65   Temp:      TempSrc:      Resp: 15 21 14 14   SpO2:        General: Examined in the emergency department. Appears calm and comfortable Eyes: PERRL, normal lids, irises  ENT: grossly normal hearing, lips & tongue Neck: no  LAD, masses or thyromegaly Cardiovascular: RRR, no m/r/g. No LE edema. Respiratory: CTA bilaterally, no w/r/r. Normal respiratory effort. Abdomen: soft, ntnd Skin: no rash or induration seen on limited exam Musculoskeletal: grossly normal tone BUE/BLE. Moves all extremities to command. Psychiatric: Denied assessment and affect. He will answer yes or no but does not engage in conversation.  Neurologic: grossly non-focal. Full assessment not possible.  Wt Readings from Last 3 Encounters:  04/16/12 92.851 kg (204 lb 11.2 oz)    Labs on Admission:  Basic Metabolic Panel:  Recent Labs Lab 03/22/13 1340  NA 141  K 3.5  CL 100  CO2 31  GLUCOSE 98  BUN 8  CREATININE 0.90  CALCIUM 9.6    Liver Function Tests:  Recent Labs Lab 03/22/13 1340  AST 38*  ALT 12  ALKPHOS 250*  BILITOT 0.6  PROT 7.8  ALBUMIN 4.0    CBC:  Recent Labs Lab 03/22/13 1340  WBC 8.3  NEUTROABS 6.7  HGB 13.9  HCT 41.5  MCV 88.7  PLT 224    Cardiac Enzymes:  Recent Labs Lab 03/22/13 1340  CKTOTAL 2812*  TROPONINI <0.30     Radiological Exams on Admission: Dg Shoulder Right  03/22/2013   CLINICAL DATA:  Status post fall with a reported history of pain with movement  EXAM: RIGHT SHOULDER - 2+ VIEW  COMPARISON:  None.  FINDINGS: There is no evidence of fracture or dislocation. There is no evidence of arthropathy or other focal bone abnormality. Soft tissues are unremarkable. A rounded calcified density projects along the lateral base of the visualized right hemi thorax is likely represents a calcified granuloma. Findings likely representing a ventriculoperitoneal shunt a partially visualized and appear unremarkable.  IMPRESSION: No evidence of acute osseous abnormalities.   Electronically Signed   By: Salome Holmes M.D.   On: 03/22/2013 14:54   Dg Forearm Right  03/22/2013   CLINICAL DATA:  Fall  EXAM: RIGHT FOREARM - 2 VIEW  COMPARISON:  None.  FINDINGS: There is no evidence of fracture  or other focal bone lesions. Soft tissues are unremarkable.  IMPRESSION: Negative.   Electronically Signed   By: Salome Holmes M.D.   On: 03/22/2013 14:56   Ct Head Wo Contrast  03/22/2013   CLINICAL DATA:  Fall.  Occipital headache.  EXAM: CT HEAD WITHOUT CONTRAST  TECHNIQUE: Contiguous axial images were obtained from the base of the skull through the vertex without intravenous contrast.  COMPARISON:  04/22/2012  FINDINGS: Ventricles are normal in configuration. The ventricles are normal in size. A right frontal ventriculostomy crosses into the inferior left lateral ventricle. This is stable. There is sulcal enlargement reflecting generalized atrophy. There is well-defined hypoattenuation along the genu of the corpus callosum at and to the left of midline that may  reflect an old infarct. This is stable. Hypoattenuation in the right frontal lobe also suggests an old infarct or could be posttraumatic. It is also stable. Other patchy areas of white matter hypoattenuation are likely due to chronic microvascular ischemic change. These are stable.  No parenchymal masses or mass effect. There is no evidence of a recent cortical infarct.  There are no extra-axial masses or abnormal fluid collections.  There is no intracranial hemorrhage.  There is mild ethmoid and right frontal sinus mucosal thickening. The mastoid air cells are clear. No skull fracture.  IMPRESSION: 1. No acute intracranial abnormalities. 2. Ventricles remain normal in size. Right ventriculostomy catheter is stable in position. 3. 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 4. No significant change from the prior study.   Electronically Signed   By: Amie Portland M.D.   On: 03/22/2013 14:51    EKG: Independently reviewed. Normal sinus rhythm. No acute changes.   Principal Problem:   Rhabdomyolysis Active Problems:   Colon cancer   Fall at home   Acute encephalopathy    Seizure disorder   Dementia   VP (ventriculoperitoneal) shunt status   Assessment/Plan 1. Status post fall at home: possibly medication effect. Plan as below. Physical therapy evaluation.  2. Rhabdomyolysis: Secondary to fall at home and prolonged lying on the floor, although Zyprexa can do this as well. IV fluids. Repeat basic metabolic panel in the morning and CK. There are no signs or symptoms to suggest NMS.  3. Possible acute encephalopathy: I suspect medication effect from Zyprexa superimposed on dementia. There is no signs of infection. No sign of acute CNS event. Shunt is functioning normally. Syncope also possibility related to Zyprexa. 4. History of seizure disorder: None for at least 20 years. History and exam does not suggest seizure. 5. History of colon cancer, prostate cancer 6. History of brain mass or brain cancer: History is very limited. 7. Dementia: Worsening agitation at night by history. Currently appears stable. 8. History VP shunt: CT head unremarkable, shunt appears to be functioning normally.  Discussed in detail with daughter at bedside and wife by telephone. History provided by both parties is very limited. From the provided history the patient has had increasing dementia with agitation at home, was started on Zyprexa 3 days ago this may have precipitated this fall. Plan observation overnight.  Code Status: full code  DVT prophylaxis: SCDs Family Communication: as above Disposition Plan/Anticipated LOS: obs, 1-2 days  Time spent: 75 minutes  Brendia Sacks, MD  [facility] Pager [phone] 03/22/2013, 5:27 PM

## 2013-03-22 NOTE — ED Provider Notes (Addendum)
CSN: [id number]     Arrival date & time 03/22/13  1243 History  This chart was scribed for Roney Marion, MD by Bennett Scrape, ED Scribe. This patient was seen in room APA08/APA08 and the patient's care was started at 1:47 PM.   Chief Complaint  Patient presents with  . Fall   Level 5 Caveat- H/O Dementia  The history is provided by the patient. No language interpreter was used.    HPI Comments: PCP is Dr. Fabio Bering at [facility].  Jacob Wagner is a 72 y.o. male brought in by ambulance from home, who presents to the Emergency Department complaining of a fall that occurred at an unknown time. Pt lives with wife in his home but the fall was unwitnessed by her. She found the pt laying on the floor and called EMS. Pt currently c/o right shoulder pain and occipital HA.  Later to arrival family his prehospital course in the little more clear. He was becoming increasingly agitated. 3 days ago was started on 15 mg by mouth Zyprexa.  He was fairly sedate last night. He fell. He was on the floor. Family helped him back into bed. (Tonight he got up and apparently fell to the floor again. Family could not get him up. This was with the assistance of his wife, daughter, and son-in-law.  Past Medical History  Diagnosis Date  . Seizures   . Dementia   . Colon cancer    Past Surgical History  Procedure Laterality Date  . Prostate cancer    . Colon surgery 10 yrs ago    . Shunt to head     History reviewed. No pertinent family history. History  Substance Use Topics  . Smoking status: Current Some Day Smoker  . Smokeless tobacco: Not on file  . Alcohol Use: No    Review of Systems  Unable to perform ROS: Dementia    Allergies  Ciprocinonide; Flomax; Levaquin; and Other  Home Medications   Current Outpatient Rx  Name  Route  Sig  Dispense  Refill  . acetaminophen (TYLENOL) 325 MG tablet   Oral   Take 650 mg by mouth every 6 (six) hours as needed.         Marland Kitchen atorvastatin (LIPITOR)  10 MG tablet               . OLANZapine (ZYPREXA) 15 MG tablet               . pantoprazole (PROTONIX) 40 MG tablet   Oral   Take 40 mg by mouth daily.         . phenytoin (DILANTIN) 100 MG ER capsule   Oral   Take by mouth 3 (three) times daily.          Triage Vitals: BP 120/70  Pulse 70  Temp(Src) 97.6 F (36.4 C) (Oral)  Resp 16  SpO2 96%  Physical Exam  Nursing note and vitals reviewed. Constitutional: He appears well-developed and well-nourished. No distress.  HENT:  Head: Normocephalic.  Palpable shunt in the right temporal scalp, Moist MM, small contusion over superior aspect of right eye, discoloration over the right cheek and right nose that is questionable bruising   Eyes: Conjunctivae are normal. Pupils are equal, round, and reactive to light. No scleral icterus.  2 mm and reactive pupils  Neck: Normal range of motion. Neck supple. No thyromegaly present.  Cardiovascular: Normal rate and regular rhythm.  Exam reveals no gallop and no friction rub.  No murmur heard. Pulmonary/Chest: Effort normal and breath sounds normal. No respiratory distress. He has no wheezes. He has no rales. He exhibits no tenderness.  No crepitus, no obvious bruising   Abdominal: Soft. Bowel sounds are normal. He exhibits no distension. There is no tenderness. There is no rebound.  Musculoskeletal: Normal range of motion.  No pain with passive ROM of shoulders, no hip tenderness, no tenderness with ROM of lower extremities, no swelling of the extremities, good distal pulses, no apparent tenderness to any of the extremities   Neurological: He is alert.  Responsive to questioning but not oriented   Skin: Skin is warm and dry. No rash noted. No pallor.  Psychiatric: He has a normal mood and affect. His behavior is normal.    ED Course  Procedures (including critical care time)  DIAGNOSTIC STUDIES: Oxygen Saturation is 96% on room air, adequate by my interpretation.     COORDINATION OF CARE: 1:59 PM-Will order CT of head, ammonia level, x-ray of the right shoulder and UA.  Labs Review Labs Reviewed  CBC WITH DIFFERENTIAL - Abnormal; Notable for the following:    Neutrophils Relative % 81 (*)    Lymphocytes Relative 9 (*)    All other components within normal limits  COMPREHENSIVE METABOLIC PANEL - Abnormal; Notable for the following:    AST 38 (*)    Alkaline Phosphatase 250 (*)    GFR calc non Af Amer 83 (*)    All other components within normal limits  CK - Abnormal; Notable for the following:    Total CK 2812 (*)    All other components within normal limits  URINALYSIS, ROUTINE W REFLEX MICROSCOPIC - Abnormal; Notable for the following:    Hgb urine dipstick MODERATE (*)    All other components within normal limits  TROPONIN I  PHENYTOIN LEVEL, TOTAL  URINE MICROSCOPIC-ADD ON   Imaging Review Dg Shoulder Right  03/22/2013   CLINICAL DATA:  Status post fall with a reported history of pain with movement  EXAM: RIGHT SHOULDER - 2+ VIEW  COMPARISON:  None.  FINDINGS: There is no evidence of fracture or dislocation. There is no evidence of arthropathy or other focal bone abnormality. Soft tissues are unremarkable. A rounded calcified density projects along the lateral base of the visualized right hemi thorax is likely represents a calcified granuloma. Findings likely representing a ventriculoperitoneal shunt a partially visualized and appear unremarkable.  IMPRESSION: No evidence of acute osseous abnormalities.   Electronically Signed   By: Salome Holmes M.D.   On: 03/22/2013 14:54   Dg Forearm Right  03/22/2013   CLINICAL DATA:  Fall  EXAM: RIGHT FOREARM - 2 VIEW  COMPARISON:  None.  FINDINGS: There is no evidence of fracture or other focal bone lesions. Soft tissues are unremarkable.  IMPRESSION: Negative.   Electronically Signed   By: Salome Holmes M.D.   On: 03/22/2013 14:56   Ct Head Wo Contrast  03/22/2013   CLINICAL DATA:  Fall.   Occipital headache.  EXAM: CT HEAD WITHOUT CONTRAST  TECHNIQUE: Contiguous axial images were obtained from the base of the skull through the vertex without intravenous contrast.  COMPARISON:  04/22/2012  FINDINGS: Ventricles are normal in configuration. The ventricles are normal in size. A right frontal ventriculostomy crosses into the inferior left lateral ventricle. This is stable. There is sulcal enlargement reflecting generalized atrophy. There is well-defined hypoattenuation along the genu of the corpus callosum at and to the left of midline that  may reflect an old infarct. This is stable. Hypoattenuation in the right frontal lobe also suggests an old infarct or could be posttraumatic. It is also stable. Other patchy areas of white matter hypoattenuation are likely due to chronic microvascular ischemic change. These are stable.  No parenchymal masses or mass effect. There is no evidence of a recent cortical infarct.  There are no extra-axial masses or abnormal fluid collections.  There is no intracranial hemorrhage.  There is mild ethmoid and right frontal sinus mucosal thickening. The mastoid air cells are clear. No skull fracture.  IMPRESSION: 1. No acute intracranial abnormalities. 2. Ventricles remain normal in size. Right ventriculostomy catheter is stable in position. 3. 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 4. No significant change from the prior study.   Electronically Signed   By: Amie Portland M.D.   On: 03/22/2013 14:51    EKG Interpretation    Date/Time:    Ventricular Rate:    PR Interval:    QRS Duration:   QT Interval:    QTC Calculation:   R Axis:     Text Interpretation:            NSR. Incomplete RBBB NO injury/ischemia  MDM   1. Weakness   2. Syncope   3. Rhabdomyolysis     Differential diagnosis would be fall from weakness secondary to medications. Possible seizure. No sign of stroke.  No changes on head CT. He remains somewhat sedate. He is quite unsteady and cannot stand at the bedside unassisted.  I personally performed the services described in this documentation, which was scribed in my presence. The recorded information has been reviewed and is accurate.    Roney Marion, MD 03/22/13 1702  Roney Marion, MD 03/22/13 1738  Roney Marion, MD 03/22/13 2108

## 2013-03-23 LAB — BASIC METABOLIC PANEL
CO2: 28 mEq/L (ref 19–32)
Chloride: 107 mEq/L (ref 96–112)
Creatinine, Ser: 0.7 mg/dL (ref 0.50–1.35)
GFR calc Af Amer: >90 mL/min (ref >90)
GFR calc non Af Amer: >90 mL/min (ref >90)
Potassium: 3.3 mEq/L — ABNORMAL LOW (ref 3.5–5.1)
Sodium: 144 mEq/L (ref 135–145)

## 2013-03-23 LAB — CBC
HCT: 40.4 % (ref 39.0–52.0)
Hemoglobin: 13.2 g/dL (ref 13.0–17.0)
RBC: 4.51 MIL/uL (ref 4.22–5.81)
WBC: 5.6 10*3/uL (ref 4.0–10.5)

## 2013-03-23 LAB — CK: Total CK: 2662 U/L — ABNORMAL HIGH (ref 7–232)

## 2013-03-23 MED ORDER — PHENYTOIN SODIUM EXTENDED 100 MG PO CAPS
200.0000 mg | ORAL_CAPSULE | Freq: Every day | ORAL | Status: DC
Start: 2013-03-23 — End: 2013-03-24
  Administered 2013-03-23: 200 mg via ORAL
  Filled 2013-03-23: qty 2

## 2013-03-23 MED ORDER — ATORVASTATIN CALCIUM 10 MG PO TABS
10.0000 mg | ORAL_TABLET | Freq: Every day | ORAL | Status: DC
Start: 2013-03-23 — End: 2013-03-24
  Administered 2013-03-23: 10 mg via ORAL
  Filled 2013-03-23: qty 1

## 2013-03-23 MED ORDER — PANTOPRAZOLE SODIUM 40 MG PO TBEC
40.0000 mg | DELAYED_RELEASE_TABLET | Freq: Every morning | ORAL | Status: DC
Start: 2013-03-23 — End: 2013-03-24
  Administered 2013-03-23: 40 mg via ORAL
  Filled 2013-03-23 (×2): qty 1

## 2013-03-23 NOTE — Progress Notes (Signed)
Patients daughter at bedside - requests for PT to no longer see patient.  Will notify PT and MD

## 2013-03-23 NOTE — Progress Notes (Signed)
[facility] PROGRESS NOTE  Jacob Wagner EXB:[id number] DOB: February 26, 1941 DOA: 03/22/2013 PCP: [name], MD  Summary: 72 year old male with history of dementia, seizure disorder, VP shunt placement who was found down on the floor for an unknown period of time by his family. Initial evaluation in the emergency department was notable for gait instability and rhabdomyolysis the patient was referred for admission.  Patient rapidly improved and currently is at baseline. Continue IV fluids today repeat labs in the morning, likely home 11/19.  Assessment/Plan: 1. Rhabdomyolysis status post fall at home: Creatinine is preserved. CK with modest improvement. Continue IV fluids. Repeat CK in the morning. BMP in the morning. 2. Status post fall at home: Suspect secondary to Zyprexa. Zyprexa discontinued. 3. Acute encephalopathy: Resolved. Likely secondary to Zyprexa. No signs or symptoms to suggest stroke. 4. Seizure disorder: Well controlled. No history to suggest recurrent seizure. Has not had a seizure for at least 20 years. 5. History of colon cancer, prostate cancer 6. History of brain mass or brain cancer: History very limited. Followup as an outpatient. 7. Dementia: Worsening agitation as an outpatient. Currently quite stable.   Clearly improved today. Per daughter at bedside the patient is back to baseline. We will plan continued IV fluids with repeat CK and BMP in the morning. If these are unremarkable would anticipate discharge home.  Pending studies:   none  Code Status: full code DVT prophylaxis: scds Family Communication: as above  Disposition Plan: home   Brendia Sacks, MD  [facility]  Pager [phone] If 7PM-7AM, please contact night-coverage at [email or web address], password [facility] 03/23/2013, 12:55 PM  LOS: 1 day   Consultants:  none  Procedures:  none  Antibiotics:  none  HPI/Subjective: Much better today. Right shoulder pain has improved. No  complaints. A good breakfast.  Objective: Filed Vitals:   03/22/13 1800 03/22/13 1854 03/22/13 2155 03/23/13 0500  BP: 108/67 111/50 138/64 157/76  Pulse:  66 52 52  Temp:  97.6 F (36.4 C) 98.3 F (36.8 C) 98.3 F (36.8 C)  TempSrc:  Axillary Oral Oral  Resp: 13 20 18 18   Height:  6\' 4"  (1.93 m)    Weight:  79.8 kg (175 lb 14.8 oz)  81.1 kg (178 lb 12.7 oz)  SpO2:  100% 99% 99%    Intake/Output Summary (Last 24 hours) at 03/23/13 1255 Last data filed at 03/23/13 0900  Gross per 24 hour  Intake 1285.67 ml  Output   1375 ml  Net -89.33 ml     Filed Weights   03/22/13 1854 03/23/13 0500  Weight: 79.8 kg (175 lb 14.8 oz) 81.1 kg (178 lb 12.7 oz)    Exam:   Afebrile, vital signs stable.  General: Appears calm and comfortable. Alert. Sitting in chair. Speech is fluent and clear. Follows commands.  Cardiovascular: Regular rate and rhythm. No murmur, rub or gallop. No lower extremity edema.  Respiratory: Clear to auscultation bilaterally. No wheezes, rales or rhonchi. Normal respiratory effort.  The right shoulder: No pain. Moves well.  Data Reviewed:  Adequate urine output  Basic metabolic panel unremarkable. Potassium 3.3.  Total CK slightly decreased 2662  CBC unremarkable  Scheduled Meds: . phenytoin (DILANTIN) IV  200 mg Intravenous QHS  . phenytoin (DILANTIN) IV  100 mg Intravenous Daily   Continuous Infusions: . sodium chloride 100 mL/hr at 03/23/13 6644    Principal Problem:   Rhabdomyolysis Active Problems:   Colon cancer   Fall at home   Acute encephalopathy  Seizure disorder   Dementia   VP (ventriculoperitoneal) shunt status   Time spent 20 minutes

## 2013-03-23 NOTE — Progress Notes (Signed)
Utilization review completed.  

## 2013-03-23 NOTE — Evaluation (Signed)
Physical Therapy Evaluation Patient Details Name: Jacob Wagner MRN: [id number] DOB: [birth year] Today's Date: 03/23/2013 Time: 4098-1191 PT Time Calculation (min): 32 min  PT Assessment / Plan / Recommendation History of Present Illness  patient with dementia , seizure disorder, VP shunt, fell at home and was admitted with rhabdomylosis . He lives with family and is normally independent at home. He has chronic tremor in right  upper extremity.  Clinical Impression   Pt was seen for evaluation.  He was very pleasant and cooperative, able to follow directions.  He was found to have significant gait instability and now needs a walker for gait.  In addition, he has such a strong tremor in the RUE that he needs to have the right side of the walker weighted in order to avoid loss of control.  I have recommended HHPT to the daughter but she declines.  She is hoping that the medication he had been taking (and suspected of causing this problem) is metabolized and he returns to baseline.  I advised her that HHPT could be accessed from home via town MD.  We will keep him on schedule while in the hospital.    PT Assessment  Patent does not need any further PT services (by family request)    Follow Up Recommendations  No PT follow up    Does the patient have the potential to tolerate intense rehabilitation      Barriers to Discharge        Equipment Recommendations  None recommended by PT    Recommendations for Other Services     Frequency      Precautions / Restrictions Precautions Precautions: Fall Restrictions Weight Bearing Restrictions: No   Pertinent Vitals/Pain       Mobility  Bed Mobility Bed Mobility: Supine to Sit Supine to Sit: 6: Modified independent (Device/Increase time) Transfers Transfers: Sit to Stand Sit to Stand: 4: Min guard Ambulation/Gait Ambulation/Gait Assistance: 3: Mod assist Ambulation Distance (Feet): 80 Feet Assistive device: Rolling walker Gait  Pattern: Ataxic General Gait Details: patient need RW for mobility and moderate A for stability and to decrease the treamors on RUE  Stairs: No Wheelchair Mobility Wheelchair Mobility: No    Exercises     PT Diagnosis:    PT Problem List:   PT Treatment Interventions:       PT Goals(Current goals can be found in the care plan section) Acute Rehab PT Goals Patient Stated Goal: go home PT Goal Formulation: With patient/family Time For Goal Achievement: 04/06/13 Potential to Achieve Goals: Good  Visit Information  Last PT Received On: 03/23/13 History of Present Illness: patient with dementia , seizure disorder, VP shunt, fell at home and was admitted with rhabdomylosis . He lives with family and is normally independent at home. He has chronic tremor in right  upper extremity.       Prior Functioning  Home Living Family/patient expects to be discharged to:: Private residence Living Arrangements: Children;Spouse/significant other Available Help at Discharge: Family;Available 24 hours/day Type of Home: House Home Access: Ramped entrance Home Layout: One level Home Equipment: Walker - 2 wheels;Cane - quad Prior Function Level of Independence: Independent Communication Communication: No difficulties    Cognition  Cognition Arousal/Alertness: Awake/alert Behavior During Therapy: WFL for tasks assessed/performed Overall Cognitive Status: History of cognitive impairments - at baseline    Extremity/Trunk Assessment Upper Extremity Assessment Upper Extremity Assessment: RUE deficits/detail RUE Deficits / Details: moderate resting and intentional tremors so prominet that he is now  left hand dominent Lower Extremity Assessment Lower Extremity Assessment: Overall WFL for tasks assessed   Balance Balance Balance Assessed: No  End of Session PT - End of Session Equipment Utilized During Treatment: Gait belt Activity Tolerance: Patient tolerated treatment well Patient left: in  chair;with family/visitor present;with call bell/phone within reach  GP     Myrlene Broker L 03/23/2013, 3:26 PM

## 2013-03-23 NOTE — Care Management Note (Signed)
    Page 1 of 1   03/24/2013     12:09:07 PM   CARE MANAGEMENT NOTE 03/24/2013  Patient:  Jacob Wagner, Jacob Wagner   Account Number:  [id number]  Date Initiated:  03/23/2013  Documentation initiated by:  Rosemary Holms  Subjective/Objective Assessment:   Pt admitted from home. States he lives with his wife but then states his daughter lives with his wife to take care of her. CM offered HH but he states someone come to the house for his wife and he does not need anything. Message left on     Action/Plan:   Colette's cell phone to call and discuss discharge needs. Daughter asked RN to tell PT not to return. Pt stated to CM that he walks with cane and declined walker.   Anticipated DC Date:  03/24/2013   Anticipated DC Plan:  HOME W HOME HEALTH SERVICES      DC Planning Services  CM consult      Choice offered to / List presented to:             Status of service:  Completed, signed off Medicare Important Message given?  NA - LOS <3 / Initial given by admissions (If response is "NO", the following Medicare IM given date fields will be blank) Date Medicare IM given:   Date Additional Medicare IM given:    Discharge Disposition:  HOME/SELF CARE  Per UR Regulation:    If discussed at Long Length of Stay Meetings, dates discussed:    Comments:  03/24/13 Rosemary Holms RN BSN CM HH Declined by pt and family  03/23/13 Rosemary Holms RN BSN CM

## 2013-03-23 NOTE — Progress Notes (Signed)
Patients wife called to say that she spoke with family and that they decided to make him a DNR.  Dr. Irene Limbo called and notified of family request.

## 2013-03-24 DIAGNOSIS — R5381 Other malaise: Secondary | ICD-10-CM

## 2013-03-24 LAB — BASIC METABOLIC PANEL
CO2: 30 mEq/L (ref 19–32)
Calcium: 9.1 mg/dL (ref 8.4–10.5)
Chloride: 108 mEq/L (ref 96–112)
GFR calc Af Amer: >90 mL/min (ref >90)
Potassium: 3.8 mEq/L (ref 3.5–5.1)
Sodium: 147 mEq/L — ABNORMAL HIGH (ref 135–145)

## 2013-03-24 LAB — CK: Total CK: 2168 U/L — ABNORMAL HIGH (ref 7–232)

## 2013-03-24 MED ORDER — LORAZEPAM 2 MG/ML IJ SOLN
INTRAMUSCULAR | Status: AC
  Filled 2013-03-24: qty 1

## 2013-03-24 MED ORDER — LORAZEPAM 2 MG/ML IJ SOLN
2.0000 mg | Freq: Once | INTRAMUSCULAR | Status: AC
  Administered 2013-03-24: 2 mg via INTRAVENOUS

## 2013-03-24 NOTE — Discharge Summary (Signed)
Physician Discharge Summary  Jacob Wagner:[id number] DOB: Sep 23, 1940 DOA: 03/22/2013  PCP: [name], MD  Admit date: 03/22/2013 Discharge date: 03/24/2013  Time spent: 40 minutes  Recommendations for Outpatient Follow-up:  1. PCP Dr. [name] in 1 week for evaluation of symptoms. Recommend BMET in 1 week for evaluation of symptoms  Discharge Diagnoses:  Principal Problem:   Rhabdomyolysis Active Problems:   Colon cancer   Fall at home   Acute encephalopathy   Seizure disorder   Dementia   VP (ventriculoperitoneal) shunt status   Discharge Condition: stable  Diet recommendation: regular  Filed Weights   03/22/13 1854 03/23/13 0500  Weight: 79.8 kg (175 lb 14.8 oz) 81.1 kg (178 lb 12.7 oz)    History of present illness:  72 year old male with history of dementia, seizure disorder, VP shunt placement who was found down on the floor for an unknown period of time by his family on 03/22/13. Initial evaluation in the emergency department was notable for gait instability and rhabdomyolysis the patient was referred for admission.  History was very limited. Patient ccould provide no history. Obtained from daughter at bedside and wife by telephone. The patient has a complex medical history which is not clearly defined and there is no information in Epic. 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 It is not clear whether he received any treatment for this. The family denied that he ever had any resection. The family reported that he has progressive dementia with agitation worsening. He wanders and the family has to lock him in the room at night. He requires assistance with all ADLs with the exception of eating. He does have increasing agitation his primary care physician started him on Zyprexa 3 days prior. Sometime last night the patient fell.  On the day of admission his wife found him laying on the floor. It is unknown how long he had been laying there. He remained somewhat sleepy so he was brought to the emergency department. Has history of seizure disorder but has not had a seizure in 20 years. There are no signs or symptoms to suggest infection but the family is aware of.   In the emergency department  Found to be afebrile with stable vital signs. Complete metabolic panel notable for elevated alkaline phosphatase. AST mildly elevated at 38. Total CK 2012. Troponin negative. CBC unremarkable. Phenytoin level normal. Urinalysis unremarkable. CT of the head and not acute. EKG nonacute. Forearm and shoulder films unremarkable.   Hospital Course:  1. Rhabdomyolysis status post fall at home: provided with IV fluids.  Creatinine is preserved. CK trending downward. Recommend BMET in 1 week with follow up to PCP 2. Status post fall at home: Suspect secondary to Zyprexa. This medication started 3 days prior to admission. Zyprexa discontinued. 3. Acute encephalopathy: Resolved. Likely secondary to Zyprexa. No signs or symptoms to suggest stroke. 4. Seizure disorder: Well controlled. No history to suggest recurrent seizure. Has not had a seizure for at least 20 years. 5. History of colon cancer, prostate cancer 6. History of brain mass or brain cancer: History very limited. Followup as an outpatient. 7. Dementia: Worsening agitation as an outpatient. Tended to have "sundowners" . Recommend safety sitter at night vs locking in room. Will request [facility] SW for evaluation. Stable at baseline at discharge.   Procedures:  none  Consultations:  none  Discharge Exam: Filed Vitals:   03/23/13 2243  BP: 141/72  Pulse: 57  Temp: 97.6 F (36.4 C)  Resp: 18    General: well nourished NAD Cardiovascular: RRR No MGR no LE edema PPP Respiratory: normal effort BS clear bilaterally no wheeze no rhonchi Neuro: somewhat drowsy but responds to verbal  stimuli. Follow commands.   Discharge Instructions     Medication List    STOP taking these medications       OLANZapine 15 MG tablet  Commonly known as:  ZYPREXA      TAKE these medications       atorvastatin 10 MG tablet  Commonly known as:  LIPITOR  Take 10 mg by mouth every morning.     pantoprazole 40 MG tablet  Commonly known as:  PROTONIX  Take 40 mg by mouth every morning.     phenytoin 100 MG ER capsule  Commonly known as:  DILANTIN  Take 100-200 mg by mouth 2 (two) times daily. 1 in the morning and 2 at bedtime       Allergies  Allergen Reactions  . Ciprocinonide [Fluocinolone]   . Flomax [Tamsulosin Hcl]   . Levaquin [Levofloxacin In D5w]   . Other     Wife states NO MRI due to shunt in pts head.    Follow-up Information   Follow up with [name], MD. Schedule an appointment as soon as possible for a visit in 2 weeks. (evaluation of symptoms)    Specialty:  Internal Medicine   Contact information:   [address] [place] [address] [phone]        The results of significant diagnostics from this hospitalization (including imaging, microbiology, ancillary and laboratory) are listed below for reference.    Significant Diagnostic Studies: Dg Shoulder Right  03/22/2013   CLINICAL DATA:  Status post fall with a reported history of pain with movement  EXAM: RIGHT SHOULDER - 2+ VIEW  COMPARISON:  None.  FINDINGS: There is no evidence of fracture or dislocation. There is no evidence of arthropathy or other focal bone abnormality. Soft tissues are unremarkable. A rounded calcified density projects along the lateral base of the visualized right hemi thorax is likely represents a calcified granuloma. Findings likely representing a ventriculoperitoneal shunt a partially visualized and appear unremarkable.  IMPRESSION: No evidence of acute osseous abnormalities.   Electronically Signed   By: Salome Holmes M.D.   On: 03/22/2013  14:54   Dg Forearm Right  03/22/2013   CLINICAL DATA:  Fall  EXAM: RIGHT FOREARM - 2 VIEW  COMPARISON:  None.  FINDINGS: There is no evidence of fracture or other focal bone lesions. Soft tissues are unremarkable.  IMPRESSION: Negative.   Electronically Signed   By: Salome Holmes M.D.   On: 03/22/2013 14:56   Ct Head Wo Contrast  03/22/2013   CLINICAL DATA:  Fall.  Occipital headache.  EXAM: CT HEAD WITHOUT CONTRAST  TECHNIQUE: Contiguous axial images were obtained from the base of the skull through the vertex without intravenous contrast.  COMPARISON:  04/22/2012  FINDINGS: Ventricles are normal in configuration. The ventricles are normal in size. A right frontal ventriculostomy crosses into the inferior left lateral ventricle. This is stable. There is sulcal enlargement reflecting generalized atrophy. There is well-defined hypoattenuation along the genu of the corpus callosum at and to the left of midline that may reflect an old infarct. This is stable. Hypoattenuation in the right frontal lobe also suggests  an old infarct or could be posttraumatic. It is also stable. Other patchy areas of white matter hypoattenuation are likely due to chronic microvascular ischemic change. These are stable.  No parenchymal masses or mass effect. There is no evidence of a recent cortical infarct.  There are no extra-axial masses or abnormal fluid collections.  There is no intracranial hemorrhage.  There is mild ethmoid and right frontal sinus mucosal thickening. The mastoid air cells are clear. No skull fracture.  IMPRESSION: 1. No acute intracranial abnormalities. 2. Ventricles remain normal in size. Right ventriculostomy catheter is stable in position. 3. 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 4. No significant change from the prior study.   Electronically Signed   By: Amie Portland M.D.   On: 03/22/2013 14:51    Microbiology: No results  found for this or any previous visit (from the past 240 hour(s)).   Labs: Basic Metabolic Panel:  Recent Labs Lab 03/22/13 1340 03/23/13 0507 03/24/13 0526  NA 141 144 147*  K 3.5 3.3* 3.8  CL 100 107 108  CO2 31 28 30   GLUCOSE 98 82 92  BUN 8 7 7   CREATININE 0.90 0.70 0.79  CALCIUM 9.6 8.9 9.1   Liver Function Tests:  Recent Labs Lab 03/22/13 1340  AST 38*  ALT 12  ALKPHOS 250*  BILITOT 0.6  PROT 7.8  ALBUMIN 4.0   No results found for this basename: LIPASE, AMYLASE,  in the last 168 hours No results found for this basename: AMMONIA,  in the last 168 hours CBC:  Recent Labs Lab 03/22/13 1340 03/23/13 0507  WBC 8.3 5.6  NEUTROABS 6.7  --   HGB 13.9 13.2  HCT 41.5 40.4  MCV 88.7 89.6  PLT 224 191   Cardiac Enzymes:  Recent Labs Lab 03/22/13 1340 03/23/13 0507 03/24/13 0526  CKTOTAL 2812* 2662* 2168*  TROPONINI <0.30  --   --    BNP: BNP (last 3 results) No results found for this basename: PROBNP,  in the last 8760 hours CBG: No results found for this basename: GLUCAP,  in the last 168 hours     Signed:  Gwenyth Bender  [facility] 03/24/2013, 11:36 AM

## 2013-03-24 NOTE — Discharge Planning (Signed)
Pt and family stated that he was ready to go home and he had no pain.  Pt and family were given DC papers and educated on s/sx that may require them to call the doctor or return to the hospital.  Pt will be wheeled to car by PCT and family when ready to go.

## 2013-03-24 NOTE — Plan of Care (Signed)
Jacob Smothers, NP informed of family's wishes to make pt DNR.  Since pt has dementia and is unable to verbalize understanding, POA is Jacob Wagner) is requesting the orders.

## 2013-03-24 NOTE — Discharge Summary (Signed)
Patient seen and examined.  Above note reviewed and agree.  He appears to be back to his baseline level of functioning. Rhabdomyolysis likely related to fall.  CKs are trending down and will need to be followed up in 1 week.  Renal function intact. Encephalopathy felt to be due to zyprexa which has been discontinued.  He has been set up with home health RN and social work, since he may need placement in an ALF/memory care unit.  MEMON,JEHANZEB

## 2013-08-18 ENCOUNTER — Other Ambulatory Visit (HOSPITAL_COMMUNITY): Payer: Self-pay | Admitting: Internal Medicine

## 2013-08-18 DIAGNOSIS — Z139 Encounter for screening, unspecified: Secondary | ICD-10-CM

## 2013-08-24 ENCOUNTER — Ambulatory Visit (HOSPITAL_COMMUNITY): Payer: Medicare Other

## 2013-10-08 ENCOUNTER — Ambulatory Visit (INDEPENDENT_AMBULATORY_CARE_PROVIDER_SITE_OTHER): Payer: Medicare Other | Admitting: Urology

## 2013-10-08 DIAGNOSIS — C61 Malignant neoplasm of prostate: Secondary | ICD-10-CM

## 2014-01-07 ENCOUNTER — Ambulatory Visit (INDEPENDENT_AMBULATORY_CARE_PROVIDER_SITE_OTHER): Payer: Medicare Other | Admitting: Urology

## 2014-01-07 DIAGNOSIS — C61 Malignant neoplasm of prostate: Secondary | ICD-10-CM

## 2014-03-23 ENCOUNTER — Other Ambulatory Visit (HOSPITAL_COMMUNITY): Payer: Self-pay | Admitting: Internal Medicine

## 2014-03-23 DIAGNOSIS — R51 Headache: Principal | ICD-10-CM

## 2014-03-23 DIAGNOSIS — R519 Headache, unspecified: Secondary | ICD-10-CM

## 2014-03-25 ENCOUNTER — Ambulatory Visit (HOSPITAL_COMMUNITY)
Admission: RE | Admit: 2014-03-25 | Discharge: 2014-03-25 | Disposition: A | Discharge status: Home / Self Care | Payer: Medicare Other | Source: Ambulatory Visit | Attending: Internal Medicine | Admitting: Internal Medicine

## 2014-03-25 DIAGNOSIS — S0990XA Unspecified injury of head, initial encounter: Secondary | ICD-10-CM | POA: Insufficient documentation

## 2014-03-25 DIAGNOSIS — R519 Headache, unspecified: Secondary | ICD-10-CM

## 2014-03-25 DIAGNOSIS — W19XXXA Unspecified fall, initial encounter: Secondary | ICD-10-CM | POA: Insufficient documentation

## 2014-03-25 DIAGNOSIS — R51 Headache: Secondary | ICD-10-CM

## 2014-03-25 DIAGNOSIS — G319 Degenerative disease of nervous system, unspecified: Secondary | ICD-10-CM | POA: Insufficient documentation

## 2014-05-13 DIAGNOSIS — K219 Gastro-esophageal reflux disease without esophagitis: Secondary | ICD-10-CM | POA: Diagnosis not present

## 2014-05-13 DIAGNOSIS — Z6820 Body mass index (BMI) 20.0-20.9, adult: Secondary | ICD-10-CM | POA: Diagnosis not present

## 2014-05-13 DIAGNOSIS — Z7901 Long term (current) use of anticoagulants: Secondary | ICD-10-CM | POA: Diagnosis not present

## 2014-05-13 DIAGNOSIS — S90412A Abrasion, left great toe, initial encounter: Secondary | ICD-10-CM | POA: Diagnosis not present

## 2014-05-13 DIAGNOSIS — F0390 Unspecified dementia without behavioral disturbance: Secondary | ICD-10-CM | POA: Diagnosis not present

## 2014-05-25 DIAGNOSIS — J0100 Acute maxillary sinusitis, unspecified: Secondary | ICD-10-CM | POA: Diagnosis not present

## 2014-05-25 DIAGNOSIS — Z681 Body mass index (BMI) 19 or less, adult: Secondary | ICD-10-CM | POA: Diagnosis not present

## 2014-05-25 DIAGNOSIS — J9801 Acute bronchospasm: Secondary | ICD-10-CM | POA: Diagnosis not present

## 2014-05-25 DIAGNOSIS — F0390 Unspecified dementia without behavioral disturbance: Secondary | ICD-10-CM | POA: Diagnosis not present

## 2014-05-25 DIAGNOSIS — J209 Acute bronchitis, unspecified: Secondary | ICD-10-CM | POA: Diagnosis not present

## 2014-05-25 DIAGNOSIS — K219 Gastro-esophageal reflux disease without esophagitis: Secondary | ICD-10-CM | POA: Diagnosis not present

## 2014-06-09 ENCOUNTER — Encounter (INDEPENDENT_AMBULATORY_CARE_PROVIDER_SITE_OTHER): Payer: Self-pay | Admitting: *Deleted

## 2014-06-25 DIAGNOSIS — C61 Malignant neoplasm of prostate: Secondary | ICD-10-CM | POA: Diagnosis not present

## 2014-06-28 ENCOUNTER — Ambulatory Visit (INDEPENDENT_AMBULATORY_CARE_PROVIDER_SITE_OTHER): Payer: Self-pay | Admitting: Internal Medicine

## 2014-07-06 DIAGNOSIS — F0390 Unspecified dementia without behavioral disturbance: Secondary | ICD-10-CM | POA: Diagnosis not present

## 2014-07-06 DIAGNOSIS — G4700 Insomnia, unspecified: Secondary | ICD-10-CM | POA: Diagnosis not present

## 2014-07-06 DIAGNOSIS — Z681 Body mass index (BMI) 19 or less, adult: Secondary | ICD-10-CM | POA: Diagnosis not present

## 2014-07-06 DIAGNOSIS — K219 Gastro-esophageal reflux disease without esophagitis: Secondary | ICD-10-CM | POA: Diagnosis not present

## 2014-07-08 ENCOUNTER — Ambulatory Visit (INDEPENDENT_AMBULATORY_CARE_PROVIDER_SITE_OTHER): Payer: Medicare Other | Admitting: Urology

## 2014-07-08 DIAGNOSIS — C61 Malignant neoplasm of prostate: Secondary | ICD-10-CM

## 2014-07-11 DIAGNOSIS — I739 Peripheral vascular disease, unspecified: Secondary | ICD-10-CM | POA: Diagnosis not present

## 2014-07-11 DIAGNOSIS — L89892 Pressure ulcer of other site, stage 2: Secondary | ICD-10-CM | POA: Diagnosis not present

## 2014-08-18 DIAGNOSIS — Z681 Body mass index (BMI) 19 or less, adult: Secondary | ICD-10-CM | POA: Diagnosis not present

## 2014-08-18 DIAGNOSIS — L03116 Cellulitis of left lower limb: Secondary | ICD-10-CM | POA: Diagnosis not present

## 2014-08-22 DIAGNOSIS — L89892 Pressure ulcer of other site, stage 2: Secondary | ICD-10-CM | POA: Diagnosis not present

## 2014-08-22 DIAGNOSIS — M79672 Pain in left foot: Secondary | ICD-10-CM | POA: Diagnosis not present

## 2014-09-06 DIAGNOSIS — M79672 Pain in left foot: Secondary | ICD-10-CM | POA: Diagnosis not present

## 2014-09-06 DIAGNOSIS — L89892 Pressure ulcer of other site, stage 2: Secondary | ICD-10-CM | POA: Diagnosis not present

## 2014-09-14 ENCOUNTER — Institutional Professional Consult (permissible substitution): Payer: Medicare Other | Admitting: Neurology

## 2014-09-19 ENCOUNTER — Telehealth: Payer: Self-pay

## 2014-09-19 NOTE — Telephone Encounter (Signed)
Patient's wife called to reschedule appointment for the patient. She stated he was too unsteady on his feet to come to his appointment last week. She stated the patient fell last week. She was going to call his primary doctor to ask him about the fall, since that doctor is already familiar with him. I offered an appointment for tomorrow 5/17, but she wanted to wait until next Wed, 5/25. Appointment scheduled.

## 2014-09-20 ENCOUNTER — Ambulatory Visit (INDEPENDENT_AMBULATORY_CARE_PROVIDER_SITE_OTHER): Payer: Medicare Other | Admitting: Neurology

## 2014-09-20 ENCOUNTER — Encounter: Payer: Self-pay | Admitting: Neurology

## 2014-09-20 VITALS — BP 106/74 | HR 62

## 2014-09-20 DIAGNOSIS — R413 Other amnesia: Secondary | ICD-10-CM

## 2014-09-20 DIAGNOSIS — Z5181 Encounter for therapeutic drug level monitoring: Secondary | ICD-10-CM

## 2014-09-20 DIAGNOSIS — G40909 Epilepsy, unspecified, not intractable, without status epilepticus: Secondary | ICD-10-CM

## 2014-09-20 DIAGNOSIS — E538 Deficiency of other specified B group vitamins: Secondary | ICD-10-CM

## 2014-09-20 DIAGNOSIS — R269 Unspecified abnormalities of gait and mobility: Secondary | ICD-10-CM | POA: Diagnosis not present

## 2014-09-20 HISTORY — DX: Unspecified abnormalities of gait and mobility: R26.9

## 2014-09-20 HISTORY — DX: Other amnesia: R41.3

## 2014-09-20 NOTE — Progress Notes (Signed)
Reason for visit: Gait disorder  Referring physician: Dr. Birdie Hopes is a 74 y.o. 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 The patient has required VP shunt placement which was done on the right. The patient has had chronic dementia, and a gait disorder. The patient fell in November 2015, CT scan of the brain done at that time was stable. The patient had been doing relatively well with his walking until about 2 weeks ago, and his walking has gotten worse, with a tendency to fall backwards. The patient fell 2 days ago, and again struck his head. The patient lives with his family. He does not ask for assistance with ambulation. He has not complained of a headache, or weakness. The patient denies any numbness of the extremities. He is on Dilantin for seizures, he has not had a seizure in many years. He does report some back pain. He is sent to this office for evaluation of the gait problems. He does have chronic issues with urinary incontinence.  Past Medical History  Diagnosis Date  . Seizures   . Dementia   . Brain tumor     thalamus  . Insomnia   . Dementia   . Hypercholesteremia   . Colon cancer   . Prostate cancer   . Stroke     left temporal  . Abnormality of gait 09/20/2014  . Memory difficulties 09/20/2014    Past Surgical History  Procedure Laterality Date  . Prostate cancer    . Colon surgery 10 yrs ago    . Shunt to head    . Foot surgery      left ankle ORIF/ fracture  . Inguinal hernia repair Right     Family History  Problem Relation Age of Onset  . Cancer Father     lung  . Benign prostatic hyperplasia Father   . Dementia Mother   . Hypertension Mother   . Hyperlipidemia Mother   . Stomach cancer Maternal Uncle   . Bone cancer Maternal Uncle     Social history:  reports that he quit smoking  about 5 years ago. He does not have any smokeless tobacco history on file. He reports that he does not drink alcohol or use illicit drugs.  Medications:  Prior to Admission medications   Medication Sig Start Date End Date Taking? Authorizing Provider  acetaminophen (TYLENOL) 325 MG tablet Take 650 mg by mouth every 6 (six) hours as needed.   Yes Historical Provider, MD  diphenoxylate-atropine (LOMOTIL) 2.5-0.025 MG per tablet Take 2 tablets by mouth 4 (four) times daily as needed for diarrhea or loose stools (for 5 days).   Yes Historical Provider, MD  phenytoin (DILANTIN) 100 MG ER capsule Take 100-200 mg by mouth 2 (two) times daily. 1 in the morning and 2 at bedtime   Yes Historical Provider, MD  silver sulfADIAZINE (SILVADENE) 1 % cream Apply 1 application topically daily.   Yes Historical Provider, MD  sulfamethoxazole-trimethoprim (BACTRIM,SEPTRA) 400-80 MG per tablet Take 1 tablet by mouth 2 (two) times daily.   Yes Historical Provider, MD      Allergies  Allergen Reactions  . Aricept [Donepezil Hcl] Diarrhea  . Ciprocinonide [Fluocinolone]   . Flomax [Tamsulosin Hcl]   . Ibuprofen   . Levaquin [Levofloxacin In D5w]   . Lipitor [Atorvastatin]  dizziness  . Namenda [Memantine Hcl]     itch  . Other     Wife states NO MRI due to shunt in pts head.     ROS:  Out of a complete 14 system review of symptoms, the patient complains only of the following symptoms, and all other reviewed systems are negative.  Weight loss Blurred vision Diarrhea, constipation Incontinence of bladder Moles Feeling cold Allergies, runny nose Memory loss, confusion, headache, dizziness, seizures, tremor Depression, anxiety, not enough sleep, change in appetite Insomnia, snoring  Blood pressure 106/74, pulse 62.  Physical Exam  General: The patient is alert and cooperative at the time of the examination.  Eyes: Pupils are equal, round, and reactive to light. Discs are flat  bilaterally.  Neck: The neck is supple, no carotid bruits are noted.  Respiratory: The respiratory examination is clear.  Cardiovascular: The cardiovascular examination reveals a regular rate and rhythm, no obvious murmurs or rubs are noted.  Skin: Extremities are without significant edema.  Neurologic Exam  Mental status: The patient is alert and cooperative, but he scores 7 out of 30 on the Mini-Mental Status Examination.  Cranial nerves: Facial symmetry is present. There is good sensation of the face to pinprick and soft touch bilaterally. The strength of the facial muscles and the muscles to head turning and shoulder shrug are normal bilaterally. Speech is well enunciated, no aphasia or dysarthria is noted. Extraocular movements are full. Visual fields are full. The tongue is midline, and the patient has symmetric elevation of the soft palate. No obvious hearing deficits are noted.  Motor: The motor testing reveals 5 over 5 strength of all 4 extremities. Good symmetric motor tone is noted throughout.  Sensory: Sensory testing is intact to pinprick, soft touch, vibration sensation on all 4 extremities. No evidence of extinction is noted.  Coordination: Cerebellar testing reveals good finger-nose-finger and heel-to-shin on the left, the patient has a coarse intention tremor on the right arm, able to perform heel-to-shin with the right leg..  Gait and station: The patient requires some assistance with standing. Once up, he has a slight tendency to lean backwards. Gait is wide-based, the patient has hesitancy with turns. Romberg is negative. Tandem gait was not attempted.  Reflexes: Deep tendon reflexes are symmetric, but are depressed  bilaterally. Toes are downgoing bilaterally.   CT head 03/25/14:  IMPRESSION: Stable exam. No acute intracranial abnormality.  Right ventriculostomy shunt catheter is stable in position. No evidence for evolving hydrocephalus.  Diffuse atrophy  with chronic microvascular ischemia and evidence of old infarcts.  * CT scan images were reviewed online. I agree with the written report.    Assessment/Plan:  1. Primary brain tumor   2. VP shunt placement   3. Chronic gait disorder with recent worsening   4. Dementia   The patient has had a change in his ambulatory abilities. The family indicates that this has occurred over the last 2 weeks. The patient is on Dilantin, we will need to check blood work today. The patient will undergo a CT scan of the brain. If the studies are unremarkable, physical therapy for gait training will be ordered. Otherwise, the patient will follow-up in 3 or 4 months.    Jill Alexanders MD 09/20/2014 8:02 PM  [facility] [address] [place], [place] [address]  Phone [phone] Fax [phone]

## 2014-09-20 NOTE — Patient Instructions (Signed)

## 2014-09-22 ENCOUNTER — Telehealth: Payer: Self-pay | Admitting: Neurology

## 2014-09-22 LAB — CBC WITH DIFFERENTIAL/PLATELET
BASOS ABS: 0 10*3/uL (ref 0.0–0.2)
Basos: 0 %
EOS (ABSOLUTE): 0 10*3/uL (ref 0.0–0.4)
Eos: 1 %
Hematocrit: 41.7 % (ref 37.5–51.0)
Hemoglobin: 14.2 g/dL (ref 12.6–17.7)
Immature Grans (Abs): 0 10*3/uL (ref 0.0–0.1)
Immature Granulocytes: 0 %
Lymphocytes Absolute: 2.4 10*3/uL (ref 0.7–3.1)
Lymphs: 51 %
MCH: 30.2 pg (ref 26.6–33.0)
MCHC: 34.1 g/dL (ref 31.5–35.7)
MCV: 89 fL (ref 79–97)
MONOCYTES: 11 %
MONOS ABS: 0.5 10*3/uL (ref 0.1–0.9)
Neutrophils Absolute: 1.7 10*3/uL (ref 1.4–7.0)
Neutrophils: 37 %
PLATELETS: 231 10*3/uL (ref 150–379)
RBC: 4.7 x10E6/uL (ref 4.14–5.80)
RDW: 14 % (ref 12.3–15.4)
WBC: 4.7 10*3/uL (ref 3.4–10.8)

## 2014-09-22 LAB — COMPREHENSIVE METABOLIC PANEL
A/G RATIO: 1.4 (ref 1.1–2.5)
ALT: 12 IU/L (ref 0–44)
AST: 14 IU/L (ref 0–40)
Albumin: 4.2 g/dL (ref 3.5–4.8)
Alkaline Phosphatase: 219 IU/L — ABNORMAL HIGH (ref 39–117)
BILIRUBIN TOTAL: 0.4 mg/dL (ref 0.0–1.2)
BUN/Creatinine Ratio: 14 (ref 10–22)
BUN: 12 mg/dL (ref 8–27)
CO2: 26 mmol/L (ref 18–29)
Calcium: 9.4 mg/dL (ref 8.6–10.2)
Chloride: 95 mmol/L — ABNORMAL LOW (ref 97–108)
Creatinine, Ser: 0.83 mg/dL (ref 0.76–1.27)
GFR calc Af Amer: 100 mL/min/{1.73_m2} (ref >59)
GFR calc non Af Amer: 87 mL/min/{1.73_m2} (ref >59)
GLOBULIN, TOTAL: 3 g/dL (ref 1.5–4.5)
GLUCOSE: 86 mg/dL (ref 65–99)
Potassium: 4.1 mmol/L (ref 3.5–5.2)
SODIUM: 137 mmol/L (ref 134–144)
Total Protein: 7.2 g/dL (ref 6.0–8.5)

## 2014-09-22 LAB — RPR: RPR: NONREACTIVE

## 2014-09-22 LAB — METHYLMALONIC ACID, SERUM: METHYLMALONIC ACID: 182 nmol/L (ref 0–378)

## 2014-09-22 LAB — VITAMIN B12: VITAMIN B 12: 508 pg/mL (ref 211–946)

## 2014-09-22 LAB — PHENYTOIN LEVEL, TOTAL: Phenytoin Lvl: <0.6 ug/mL — ABNORMAL LOW (ref 10.0–20.0)

## 2014-09-22 LAB — COPPER, SERUM: Copper: 135 ug/dL (ref 72–166)

## 2014-09-22 NOTE — Telephone Encounter (Signed)
I called patient, talk with the wife. The blood work is unremarkable except that the Dilantin level is basically 0. Wife indicates that they're giving the medication in the patient, he may not be taking it. They're to try to get him back on the medication.

## 2014-09-22 NOTE — Telephone Encounter (Signed)
Tia, pt's daughter called requesting that the patient have his CT-SCAN at [facility] instead of [facility]. Please call and advise. Tia acn be reached @ [phone]

## 2014-09-22 NOTE — Telephone Encounter (Signed)
Please refer to telephone note from earlier today.

## 2014-09-22 NOTE — Telephone Encounter (Signed)
Pt daughter called asking to speak with someone regarding the pt's blood work results. Please call and advise.

## 2014-09-23 ENCOUNTER — Telehealth: Payer: Self-pay | Admitting: Neurology

## 2014-09-23 NOTE — Telephone Encounter (Signed)
Ranon Coven [phone]) the pt's daughter called and requested to speak with the nurse regarding Rx. phenytoin (DILANTIN) 100 MG ER capsule. Tia stated that the pt has been taking an antibiotic and they read on the Internet that the antibiotic could interfere with the other medications he is on. Please call and advise.

## 2014-09-23 NOTE — Telephone Encounter (Signed)
I called Tia. She stated that the patient is not taking bactrim right now, but may have to start again if the infection in his toe looks like it is coming back. Per Dr. Rexene Alberts, taking Dilantin and Bactrim together are okay for short term but we would need to keep an eye on his dilantin levels. They will let us know if he has to take Bactrim again. She also looked at his last dilantin level, which was <0.6 and recommended the family make sure he gets his Dilantin and come back in about a week to recheck his dilantin level. They want to go to the [facility] on [address] in [place]. We discussed possible ways to make sure the patient actually takes his medication. The patient's wife is going to put the whole pill in applesauce/pudding and possibly remove his dentures to make sure he takes the pill.

## 2014-09-26 ENCOUNTER — Ambulatory Visit (HOSPITAL_COMMUNITY)
Admission: RE | Admit: 2014-09-26 | Discharge: 2014-09-26 | Disposition: A | Discharge status: Home / Self Care | Payer: Medicare Other | Source: Ambulatory Visit | Attending: Neurology | Admitting: Neurology

## 2014-09-26 DIAGNOSIS — F0390 Unspecified dementia without behavioral disturbance: Secondary | ICD-10-CM | POA: Diagnosis not present

## 2014-09-26 DIAGNOSIS — Z8546 Personal history of malignant neoplasm of prostate: Secondary | ICD-10-CM | POA: Diagnosis not present

## 2014-09-26 DIAGNOSIS — G40909 Epilepsy, unspecified, not intractable, without status epilepticus: Secondary | ICD-10-CM | POA: Insufficient documentation

## 2014-09-26 DIAGNOSIS — I672 Cerebral atherosclerosis: Secondary | ICD-10-CM | POA: Insufficient documentation

## 2014-09-26 DIAGNOSIS — R569 Unspecified convulsions: Secondary | ICD-10-CM | POA: Diagnosis not present

## 2014-09-26 DIAGNOSIS — Z85841 Personal history of malignant neoplasm of brain: Secondary | ICD-10-CM | POA: Diagnosis not present

## 2014-09-26 DIAGNOSIS — Z85038 Personal history of other malignant neoplasm of large intestine: Secondary | ICD-10-CM | POA: Diagnosis not present

## 2014-09-26 DIAGNOSIS — G9389 Other specified disorders of brain: Secondary | ICD-10-CM | POA: Diagnosis not present

## 2014-09-26 DIAGNOSIS — R269 Unspecified abnormalities of gait and mobility: Secondary | ICD-10-CM | POA: Insufficient documentation

## 2014-09-26 DIAGNOSIS — Z86011 Personal history of benign neoplasm of the brain: Secondary | ICD-10-CM | POA: Diagnosis not present

## 2014-09-26 NOTE — Telephone Encounter (Signed)
Patient is suppose to have dilatin levels checked and needs to get this done in [place] at [facility].  They have transportation issues.  Jobin Montelongo [phone] is his wife and the person who called

## 2014-09-26 NOTE — Telephone Encounter (Signed)
I called the patient's wife. I assured her we would send the order to the [facility] in [place] on [address]. I had discussed this with the patient's daughter, Hebert Soho, last week. I verified with the patient's wife that they would wait to go on Friday so the Dilantin would have time to get in his system. She agreed with the plan.

## 2014-09-27 ENCOUNTER — Telehealth: Payer: Self-pay | Admitting: Neurology

## 2014-09-27 DIAGNOSIS — L89892 Pressure ulcer of other site, stage 2: Secondary | ICD-10-CM | POA: Diagnosis not present

## 2014-09-27 DIAGNOSIS — R269 Unspecified abnormalities of gait and mobility: Secondary | ICD-10-CM

## 2014-09-27 DIAGNOSIS — M79672 Pain in left foot: Secondary | ICD-10-CM | POA: Diagnosis not present

## 2014-09-27 NOTE — Telephone Encounter (Signed)
I called the wife. The CT scan of the head shows right frontal encephalomalacia, the ventricular spaces are unremarkable. No change from prior study. There appears that there may be some compromise of the spinal canal with the dens, I will check a CT scan of the cervical spine to look for evidence of possible cord compression.   CT head results 09/26/14:  FINDINGS: Ventricular shunt enters from right frontal region coursing through the right frontal horn and the left frontal horn with the tip located between the left caudate head and left aspect of the third ventricle. No hydrocephalus.  Calcifications within the left thalamus/quadrigeminal plate unchanged.  Encephalomalacia right frontal lobe and anterior left temporal lobe stable.  Prominent nonspecific periventricular white matter type changes most notable genu of the corpus callosum unchanged.  No CT evidence of large acute infarct.  No intracranial hemorrhage.  Superior projection of the dens and rotation C1 -occiput region with narrowing upper cervical spine/cervical medullary junction incompletely assessed.  Vascular calcifications.  IMPRESSION: Overall similar appearance to the 03/25/2014 examination as detailed above.

## 2014-09-28 ENCOUNTER — Telehealth: Payer: Self-pay | Admitting: Neurology

## 2014-09-28 ENCOUNTER — Ambulatory Visit: Payer: Self-pay | Admitting: Neurology

## 2014-09-28 NOTE — Telephone Encounter (Signed)
I called and talked with the wife. I discussed the reason why the CT scan of the cervical spine was ordered. She indicates that the patient now only has troubles with walking, but he also has been quite drowsy and sleepy. The CT of the brain does not offer any insight on why that would be. We will get the CT of the cervical spine set up.

## 2014-09-28 NOTE — Telephone Encounter (Signed)
Patient's wife received a call from Dr. Jannifer Franklin yesterday.  Patients wife was unable to understand why her husband needs the CT Cervical Spine.  She would like a call back so her son Merrily Pew can get clarification for her.

## 2014-09-30 ENCOUNTER — Other Ambulatory Visit: Payer: Self-pay | Admitting: Neurology

## 2014-09-30 DIAGNOSIS — Z5181 Encounter for therapeutic drug level monitoring: Secondary | ICD-10-CM | POA: Diagnosis not present

## 2014-10-04 ENCOUNTER — Telehealth: Payer: Self-pay | Admitting: Neurology

## 2014-10-04 NOTE — Telephone Encounter (Signed)
Patient's wife [name]) called requesting results for Dilantin levels drawn on 5/17. She is also inquiring as to whether CT scan has been set up. Please call and advise. She can be reached at [phone].

## 2014-10-04 NOTE — Telephone Encounter (Signed)
I called the patient's wife and explained we do not have the patient's Dilantin level results from last Friday (5/27) yet. She was very concerned about what the results are because she stated the patient cannot stay awake. He sleeps all the time. She stated he has been doing this for about a week, ever since the patient had his CT scan. I asked if the patient had any more falls after the CT scan and she said no. I promised her we would call with the results once we had them and I would let Dr. Jannifer Franklin know about his increased sleepiness. I also promised I would send this to [facility] to set up the CT.

## 2014-10-04 NOTE — Telephone Encounter (Signed)
I called patients wife [name] and advised her of patients appt 10/10/14 2:15pm for his CT Cervical Spine.

## 2014-10-04 NOTE — Addendum Note (Signed)
Addended by: Margette Fast on: 10/04/2014 04:38 PM   Modules accepted: Medications

## 2014-10-04 NOTE — Telephone Encounter (Signed)
error 

## 2014-10-04 NOTE — Telephone Encounter (Signed)
I called the family. The Dilantin level was 31.2. The patient had been on Bactrim previously, but he is off the Bactrim now. I will have him hold the Dilantin for 3 days, restart the dose at 100 mg twice daily. We will need to recheck the blood levels in about 3 weeks.

## 2014-10-05 LAB — PHENYTOIN LEVEL, TOTAL: PHENYTOIN LVL: 31.2 ug/mL — AB (ref 10.0–20.0)

## 2014-10-10 ENCOUNTER — Telehealth: Payer: Self-pay | Admitting: Neurology

## 2014-10-10 ENCOUNTER — Ambulatory Visit (HOSPITAL_COMMUNITY)
Admission: RE | Admit: 2014-10-10 | Discharge: 2014-10-10 | Disposition: A | Discharge status: Home / Self Care | Payer: Medicare Other | Source: Ambulatory Visit | Attending: Neurology | Admitting: Neurology

## 2014-10-10 DIAGNOSIS — M47812 Spondylosis without myelopathy or radiculopathy, cervical region: Secondary | ICD-10-CM | POA: Diagnosis not present

## 2014-10-10 DIAGNOSIS — R2689 Other abnormalities of gait and mobility: Secondary | ICD-10-CM | POA: Diagnosis not present

## 2014-10-10 DIAGNOSIS — M5032 Other cervical disc degeneration, mid-cervical region: Secondary | ICD-10-CM | POA: Diagnosis not present

## 2014-10-10 DIAGNOSIS — S199XXA Unspecified injury of neck, initial encounter: Secondary | ICD-10-CM | POA: Diagnosis not present

## 2014-10-10 DIAGNOSIS — R269 Unspecified abnormalities of gait and mobility: Secondary | ICD-10-CM

## 2014-10-10 NOTE — Telephone Encounter (Signed)
  I called the family. The CT of the cervical spine shows some spinal stenosis, but nothing is critical. No indication for surgery.  Ct cervical 10/10/14:  IMPRESSION: 1. No acute osseous abnormality identified in the cervical spine. 2. Vague increased hyperdensity at the cervicomedullary junction, nonspecific but favor degenerative ligamentous hypertrophy. 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 Cervical spine MRI would evaluate further. 3. Advanced disc and endplate degeneration from C4-C5 to the upper thoracic spine, and right side upper cervical facet degeneration. Subsequent spinal stenosis suspected from C4-C5 to C6-C7, and up to severe multilevel cervical foraminal stenosis as detailed above.

## 2014-10-11 ENCOUNTER — Institutional Professional Consult (permissible substitution): Payer: Medicare Other | Admitting: Neurology

## 2014-10-18 ENCOUNTER — Telehealth: Payer: Self-pay | Admitting: Neurology

## 2014-10-18 DIAGNOSIS — Z5181 Encounter for therapeutic drug level monitoring: Secondary | ICD-10-CM

## 2014-10-18 NOTE — Telephone Encounter (Signed)
Kayla called and said that the patient called her and stated that he was supposed to come in and have his DILANTIN checked, she has no such orders but wants to clarify with the nurse. Please call and advise.

## 2014-10-18 NOTE — Telephone Encounter (Signed)
Patient's wife [name] is calling to see if the Dilantin needs to be checked again and does he stay on the same dosage?  Please call.

## 2014-10-18 NOTE — Telephone Encounter (Signed)
I called the wife. I had a computer reminder to call the next week to get the Dilantin level checked. I will go ahead and put an order in the computer so that they can get it checked at the end of this week.

## 2014-10-20 NOTE — Telephone Encounter (Signed)
Patient's wife called inquiring of lab order had been sent to [facility] in [place]. Please call and advise. Patient can be reached [phone].

## 2014-10-21 NOTE — Telephone Encounter (Signed)
I called the patient's wife. She requested the order be sent to the [facility] on [address] in [place]. I have faxed the order and received confirmation.

## 2014-10-25 ENCOUNTER — Other Ambulatory Visit: Payer: Self-pay | Admitting: Neurology

## 2014-10-25 DIAGNOSIS — I739 Peripheral vascular disease, unspecified: Secondary | ICD-10-CM | POA: Diagnosis not present

## 2014-10-25 DIAGNOSIS — Z5181 Encounter for therapeutic drug level monitoring: Secondary | ICD-10-CM | POA: Diagnosis not present

## 2014-10-25 DIAGNOSIS — L89892 Pressure ulcer of other site, stage 2: Secondary | ICD-10-CM | POA: Diagnosis not present

## 2014-10-26 ENCOUNTER — Telehealth: Payer: Self-pay | Admitting: Neurology

## 2014-10-26 LAB — PHENYTOIN LEVEL, TOTAL: Phenytoin Lvl: 17.2 ug/mL (ref 10.0–20.0)

## 2014-10-26 MED ORDER — PHENYTOIN SODIUM EXTENDED 100 MG PO CAPS: 100.0000 mg | ORAL_CAPSULE | Freq: Two times a day (BID) | ORAL | Status: DC

## 2014-10-26 NOTE — Telephone Encounter (Signed)
I called the patient, talk with the wife. The Dilantin level is 17.2. The patient is on 100 mg twice daily, we will leave him on this dose.

## 2014-10-26 NOTE — Telephone Encounter (Signed)
Patient's wife is calling to get lab results for the patient.

## 2014-11-04 DIAGNOSIS — C61 Malignant neoplasm of prostate: Secondary | ICD-10-CM | POA: Diagnosis not present

## 2014-11-15 DIAGNOSIS — Z0001 Encounter for general adult medical examination with abnormal findings: Secondary | ICD-10-CM | POA: Diagnosis not present

## 2014-11-15 DIAGNOSIS — Z1389 Encounter for screening for other disorder: Secondary | ICD-10-CM | POA: Diagnosis not present

## 2014-11-15 DIAGNOSIS — F0390 Unspecified dementia without behavioral disturbance: Secondary | ICD-10-CM | POA: Diagnosis not present

## 2014-11-15 DIAGNOSIS — Z681 Body mass index (BMI) 19 or less, adult: Secondary | ICD-10-CM | POA: Diagnosis not present

## 2014-11-16 ENCOUNTER — Emergency Department (HOSPITAL_COMMUNITY): Payer: Medicare Other

## 2014-11-16 ENCOUNTER — Encounter (HOSPITAL_COMMUNITY): Payer: Self-pay | Admitting: *Deleted

## 2014-11-16 ENCOUNTER — Inpatient Hospital Stay (HOSPITAL_COMMUNITY)
Admission: EM | Admit: 2014-11-16 | Discharge: 2014-11-19 | DRG: 871 | Disposition: A | Discharge status: Home / Self Care | Payer: Medicare Other | Attending: Internal Medicine | Admitting: Internal Medicine

## 2014-11-16 DIAGNOSIS — N390 Urinary tract infection, site not specified: Secondary | ICD-10-CM

## 2014-11-16 DIAGNOSIS — L89899 Pressure ulcer of other site, unspecified stage: Secondary | ICD-10-CM | POA: Diagnosis present

## 2014-11-16 DIAGNOSIS — Z800 Family history of malignant neoplasm of digestive organs: Secondary | ICD-10-CM

## 2014-11-16 DIAGNOSIS — G40909 Epilepsy, unspecified, not intractable, without status epilepticus: Secondary | ICD-10-CM | POA: Diagnosis not present

## 2014-11-16 DIAGNOSIS — Z8249 Family history of ischemic heart disease and other diseases of the circulatory system: Secondary | ICD-10-CM | POA: Diagnosis not present

## 2014-11-16 DIAGNOSIS — Z66 Do not resuscitate: Secondary | ICD-10-CM | POA: Diagnosis not present

## 2014-11-16 DIAGNOSIS — G9340 Encephalopathy, unspecified: Secondary | ICD-10-CM | POA: Diagnosis present

## 2014-11-16 DIAGNOSIS — Z8546 Personal history of malignant neoplasm of prostate: Secondary | ICD-10-CM

## 2014-11-16 DIAGNOSIS — R4182 Altered mental status, unspecified: Secondary | ICD-10-CM | POA: Diagnosis not present

## 2014-11-16 DIAGNOSIS — A419 Sepsis, unspecified organism: Principal | ICD-10-CM | POA: Diagnosis present

## 2014-11-16 DIAGNOSIS — B9620 Unspecified Escherichia coli [E. coli] as the cause of diseases classified elsewhere: Secondary | ICD-10-CM | POA: Diagnosis not present

## 2014-11-16 DIAGNOSIS — N3000 Acute cystitis without hematuria: Secondary | ICD-10-CM | POA: Diagnosis not present

## 2014-11-16 DIAGNOSIS — Z87891 Personal history of nicotine dependence: Secondary | ICD-10-CM

## 2014-11-16 DIAGNOSIS — Z8673 Personal history of transient ischemic attack (TIA), and cerebral infarction without residual deficits: Secondary | ICD-10-CM

## 2014-11-16 DIAGNOSIS — Z85038 Personal history of other malignant neoplasm of large intestine: Secondary | ICD-10-CM

## 2014-11-16 DIAGNOSIS — F0390 Unspecified dementia without behavioral disturbance: Secondary | ICD-10-CM | POA: Diagnosis present

## 2014-11-16 DIAGNOSIS — Z808 Family history of malignant neoplasm of other organs or systems: Secondary | ICD-10-CM

## 2014-11-16 DIAGNOSIS — E780 Pure hypercholesterolemia: Secondary | ICD-10-CM | POA: Diagnosis not present

## 2014-11-16 DIAGNOSIS — R509 Fever, unspecified: Secondary | ICD-10-CM

## 2014-11-16 DIAGNOSIS — Z982 Presence of cerebrospinal fluid drainage device: Secondary | ICD-10-CM

## 2014-11-16 DIAGNOSIS — R404 Transient alteration of awareness: Secondary | ICD-10-CM | POA: Diagnosis not present

## 2014-11-16 DIAGNOSIS — R269 Unspecified abnormalities of gait and mobility: Secondary | ICD-10-CM

## 2014-11-16 DIAGNOSIS — E43 Unspecified severe protein-calorie malnutrition: Secondary | ICD-10-CM | POA: Diagnosis not present

## 2014-11-16 DIAGNOSIS — R531 Weakness: Secondary | ICD-10-CM | POA: Diagnosis not present

## 2014-11-16 LAB — CBC
HCT: 37.2 % — ABNORMAL LOW (ref 39.0–52.0)
Hemoglobin: 12.4 g/dL — ABNORMAL LOW (ref 13.0–17.0)
MCH: 29.5 pg (ref 26.0–34.0)
MCHC: 33.3 g/dL (ref 30.0–36.0)
MCV: 88.4 fL (ref 78.0–100.0)
Platelets: 161 10*3/uL (ref 150–400)
RBC: 4.21 MIL/uL — ABNORMAL LOW (ref 4.22–5.81)
RDW: 13.6 % (ref 11.5–15.5)
WBC: 8.3 10*3/uL (ref 4.0–10.5)

## 2014-11-16 LAB — PHENYTOIN LEVEL, TOTAL: Phenytoin Lvl: 16.2 ug/mL (ref 10.0–20.0)

## 2014-11-16 LAB — URINE MICROSCOPIC-ADD ON

## 2014-11-16 LAB — COMPREHENSIVE METABOLIC PANEL
ALT: 18 U/L (ref 17–63)
ANION GAP: 8 (ref 5–15)
AST: 34 U/L (ref 15–41)
Albumin: 3.3 g/dL — ABNORMAL LOW (ref 3.5–5.0)
Alkaline Phosphatase: 123 U/L (ref 38–126)
BUN: 16 mg/dL (ref 6–20)
CO2: 31 mmol/L (ref 22–32)
Calcium: 8.3 mg/dL — ABNORMAL LOW (ref 8.9–10.3)
Chloride: 97 mmol/L — ABNORMAL LOW (ref 101–111)
Creatinine, Ser: 0.81 mg/dL (ref 0.61–1.24)
GFR calc Af Amer: >60 mL/min (ref >60)
GFR calc non Af Amer: >60 mL/min (ref >60)
GLUCOSE: 116 mg/dL — AB (ref 65–99)
POTASSIUM: 3.8 mmol/L (ref 3.5–5.1)
Sodium: 136 mmol/L (ref 135–145)
TOTAL PROTEIN: 6.7 g/dL (ref 6.5–8.1)
Total Bilirubin: 0.8 mg/dL (ref 0.3–1.2)

## 2014-11-16 LAB — I-STAT CG4 LACTIC ACID, ED: Lactic Acid, Venous: 2.93 mmol/L (ref 0.5–2.0)

## 2014-11-16 LAB — URINALYSIS, ROUTINE W REFLEX MICROSCOPIC
Bilirubin Urine: NEGATIVE
Glucose, UA: NEGATIVE mg/dL
Nitrite: POSITIVE — AB
Protein, ur: 30 mg/dL — AB
Specific Gravity, Urine: 1.02 (ref 1.005–1.030)
UROBILINOGEN UA: 4 mg/dL — AB (ref 0.0–1.0)
pH: 7.5 (ref 5.0–8.0)

## 2014-11-16 LAB — CBG MONITORING, ED: GLUCOSE-CAPILLARY: 99 mg/dL (ref 65–99)

## 2014-11-16 MED ORDER — ENOXAPARIN SODIUM 40 MG/0.4ML ~~LOC~~ SOLN
40.0000 mg | SUBCUTANEOUS | Status: DC
  Administered 2014-11-16 – 2014-11-18 (×3): 40 mg via SUBCUTANEOUS
  Filled 2014-11-16 (×3): qty 0.4

## 2014-11-16 MED ORDER — DIPHENOXYLATE-ATROPINE 2.5-0.025 MG PO TABS: 2.0000 | ORAL_TABLET | Freq: Four times a day (QID) | ORAL | Status: DC | PRN

## 2014-11-16 MED ORDER — PHENYTOIN SODIUM 50 MG/ML IJ SOLN
100.0000 mg | Freq: Two times a day (BID) | INTRAMUSCULAR | Status: DC
  Administered 2014-11-16 – 2014-11-19 (×6): 100 mg via INTRAVENOUS
  Filled 2014-11-16 (×7): qty 2

## 2014-11-16 MED ORDER — SILVER SULFADIAZINE 1 % EX CREA
1.0000 "application " | TOPICAL_CREAM | Freq: Every day | CUTANEOUS | Status: DC
  Filled 2014-11-16: qty 85

## 2014-11-16 MED ORDER — SODIUM CHLORIDE 0.9 % IV BOLUS (SEPSIS)
250.0000 mL | Freq: Once | INTRAVENOUS | Status: AC
  Administered 2014-11-16: 250 mL via INTRAVENOUS

## 2014-11-16 MED ORDER — SODIUM CHLORIDE 0.9 % IV SOLN
INTRAVENOUS | Status: DC
  Administered 2014-11-16 – 2014-11-17 (×2): via INTRAVENOUS

## 2014-11-16 MED ORDER — ACETAMINOPHEN 325 MG PO TABS: 650.0000 mg | ORAL_TABLET | Freq: Four times a day (QID) | ORAL | Status: DC | PRN

## 2014-11-16 MED ORDER — SODIUM CHLORIDE 0.9 % IV SOLN: INTRAVENOUS | Status: AC

## 2014-11-16 MED ORDER — SODIUM CHLORIDE 0.9 % IJ SOLN
3.0000 mL | Freq: Two times a day (BID) | INTRAMUSCULAR | Status: DC
  Administered 2014-11-16 – 2014-11-19 (×4): 3 mL via INTRAVENOUS

## 2014-11-16 MED ORDER — SODIUM CHLORIDE 0.9 % IV SOLN
INTRAVENOUS | Status: DC
  Administered 2014-11-16: 13:00:00 via INTRAVENOUS

## 2014-11-16 MED ORDER — LACTULOSE 10 GM/15ML PO SOLN
10.0000 g | Freq: Every day | ORAL | Status: DC | PRN
Start: 2014-11-16 — End: 2014-11-19

## 2014-11-16 MED ORDER — DEXTROSE 5 % IV SOLN
1.0000 g | INTRAVENOUS | Status: DC
  Administered 2014-11-17 – 2014-11-19 (×3): 1 g via INTRAVENOUS
  Filled 2014-11-16 (×4): qty 10

## 2014-11-16 MED ORDER — DEXTROSE 5 % IV SOLN
1.0000 g | Freq: Once | INTRAVENOUS | Status: AC
  Administered 2014-11-16: 1 g via INTRAVENOUS
  Filled 2014-11-16: qty 10

## 2014-11-16 NOTE — H&P (Signed)
History and Physical    Jacob Wagner WNI:[id number] DOB: [birth year] DOA: 11/16/2014  Referring physician: Dr. Rogene Houston PCP: Glo Herring., MD  Specialists: none   Chief Complaint: Altered mental status  HPI: Jacob Wagner is a 74 y.o. male has a past medical history significant for dementia, seizures, stroke, hypercholesterolemia. History was provided by his son and daughter. Family noticed patient has not been acting like himself progressively worsening for the past week. Reported the altered mental status has been worsening and patient is no longer eating or drinking well. Today, patient was found unresponsive by daughter and felt warm. EMS was alerted and he was brought to the ED. In ED, patient was febrile at 101F, other vital signs unremarkable. Lactic acid was elevated. U/A suggestive of UTI. Urine and blood cultures are pending. Other laboratory values are unremarkable. CXR negative for pneumonia. CT brain negative with visualization of shunt and negative for acute intracranial abnormalities. Placed on IV Rocephin for UTI. No known history of multi-drug resistant organisms. Patient is admitted for further evaluation and treatment of UTI and acute encephalopathy.  Per son and daughter, patient has denied chest pain, palpitations, shortness of breath, dyspnea on exertion, cough, abdominal pain, nausea, vomiting, diarrhea, or decreased hearing. Family does endorse for the past two days patient has had difficulty voiding; patient has the sensation to urinate, but unable to. Patient has healing pressure ulcer on left 2nd metaphalangeal. Patient has difficulty with ambulation and balance at baseline.    Review of Systems: Unable to obtain ROS from patient due to altered mental status   Past Medical History  Diagnosis Date  . Seizures   . Dementia   . Brain tumor     thalamus  . Insomnia   . Dementia   . Hypercholesteremia   . Colon cancer   . Prostate cancer   . Stroke       left temporal  . Abnormality of gait 09/20/2014  . Memory difficulties 09/20/2014   Past Surgical History  Procedure Laterality Date  . Prostate cancer    . Colon surgery 10 yrs ago    . Shunt to head    . Foot surgery      left ankle ORIF/ fracture  . Inguinal hernia repair Right    Social History:  reports that he quit smoking about 5 years ago. He does not have any smokeless tobacco history on file. He reports that he does not drink alcohol or use illicit drugs.  Allergies  Allergen Reactions  . Aricept [Donepezil Hcl] Diarrhea  . Ciprocinonide [Fluocinolone]   . Flomax [Tamsulosin Hcl]   . Ibuprofen   . Levaquin [Levofloxacin In D5w]   . Lipitor [Atorvastatin]     dizziness  . Namenda [Memantine Hcl]     itch  . Other     Wife states NO MRI due to shunt in pts head.     Family History  Problem Relation Age of Onset  . Cancer Father     lung  . Benign prostatic hyperplasia Father   . Dementia Mother   . Hypertension Mother   . Hyperlipidemia Mother   . Stomach cancer Maternal Uncle   . Bone cancer Maternal Uncle     Prior to Admission medications   Medication Sig Start Date End Date Taking? Authorizing Provider  acetaminophen (TYLENOL) 325 MG tablet Take 650 mg by mouth every 6 (six) hours as needed for mild pain, moderate pain or headache.    Yes Historical  Provider, MD  diphenoxylate-atropine (LOMOTIL) 2.5-0.025 MG per tablet Take 2 tablets by mouth 4 (four) times daily as needed for diarrhea or loose stools (for 5 days).   Yes Historical Provider, MD  lactulose (CHRONULAC) 10 GM/15ML solution Take 15-30 mLs by mouth daily as needed. 11/04/14  Yes Historical Provider, MD  phenytoin (DILANTIN) 100 MG ER capsule Take 1 capsule (100 mg total) by mouth 2 (two) times daily. 10/26/14  Yes Kathrynn Ducking, MD  silver sulfADIAZINE (SILVADENE) 1 % cream Apply 1 application topically daily.   Yes Historical Provider, MD  sulfamethoxazole-trimethoprim (BACTRIM,SEPTRA)  400-80 MG per tablet Take 1 tablet by mouth 2 (two) times daily.   Yes Historical Provider, MD   Physical Exam: Filed Vitals:   11/16/14 1130 11/16/14 1200 11/16/14 1300 11/16/14 1330  BP: 120/72 115/78 118/65 122/76  Pulse: 80     Temp:      TempSrc:      Resp: 20 16 21 19   SpO2: 96%       General:  Patient is lying in bed with eyes closed. He is minimally responsive to commands with a single grunt, otherwise nonverbal.      Eyes: PERRL, EOMI, no scleral icterus  ENT: unable to assess  Neck: supple, no lymphadenopathy  Cardiovascular: regular rate without MRG; 2+ peripheral pulses, no JVD, no peripheral edema  Respiratory: CTA biL, good air movement without wheezing, rhonchi or crackled  Abdomen: soft, non tender to palpation, positive bowel sounds, no guarding, no rebound. Scaphoid abdomen.   Skin: no rashes. Diffuse dry skin. Healing pressure ulcer on 2nd left toe.   Musculoskeletal: decreased bulk and tone, no joint swelling  Psychiatric: minimal to no responsiveness to verbal commands.   Neurologic:  MS unable to assess due to lack of ability to follow commands and appears to voluntarily withdraw  Labs on Admission:  Basic Metabolic Panel:  Recent Labs Lab 11/16/14 1015  NA 136  K 3.8  CL 97*  CO2 31  GLUCOSE 116*  BUN 16  CREATININE 0.81  CALCIUM 8.3*   Liver Function Tests:  Recent Labs Lab 11/16/14 1015  AST 34  ALT 18  ALKPHOS 123  BILITOT 0.8  PROT 6.7  ALBUMIN 3.3*   CBC:  Recent Labs Lab 11/16/14 1015  WBC 8.3  HGB 12.4*  HCT 37.2*  MCV 88.4  PLT 161   CBG:  Recent Labs Lab 11/16/14 1035  GLUCAP 99    Radiological Exams on Admission: Dg Chest 1 View  11/16/2014   CLINICAL DATA:  Fever and altered mental status. History of colon and prostate carcinoma  EXAM: CHEST  1 VIEW  COMPARISON:  October 17, 2011  FINDINGS: There is persistent elevation of the left hemidiaphragm. There is a calcified granuloma in the right base. There is  atelectatic change in the left base. The lungs elsewhere clear. Heart is upper normal in size with pulmonary vascularity within normal limits. No adenopathy. A shunt extends along the anterior right hemithorax.  IMPRESSION: Atelectasis left base. Stable elevation left hemidiaphragm. Calcified granuloma right base. No edema or consolidation. No change in cardiac silhouette.   Electronically Signed   By: Lowella Grip III M.D.   On: 11/16/2014 13:16   Ct Head Wo Contrast  11/16/2014   CLINICAL DATA:  Altered mental status and fever. History of seizures. Found unresponsive.  EXAM: CT HEAD WITHOUT CONTRAST  TECHNIQUE: Contiguous axial images were obtained from the base of the skull through the vertex without intravenous  contrast.  COMPARISON:  Sep 26, 2014  FINDINGS: There is moderate atrophy which is stable. The ventricular shunt catheter tip passes through the lateral ventricles and resides to the left of the third ventricle slightly superior to the left suprasellar cistern. This is a stable finding. There is no intracranial mass, hemorrhage, extra-axial fluid collection, or midline shift. There is evidence of prior infarcts in the right frontal and left temporal lobes, stable. There is evidence of prior infarction throughout the rostrum of the corpus callosum, stable. There is small vessel disease throughout the centra semiovale bilaterally. There is no new gray-white compartment lesion. There is no acute infarct evident. There is no intracranial mass, hemorrhage, or extra-axial fluid collection. There is a shunt catheter defect in the right frontal bone. Bony structures otherwise appear intact. The mastoid air cells are clear.  IMPRESSION: Shunt catheter with the tip just superior to the left suprasellar cisterna lateral to the third ventricle. Prior infarcts and supratentorial small vessel disease, stable. Underlying atrophy, stable. No mass, hemorrhage, or evidence suggesting acute infarct.   Electronically  Signed   By: Lowella Grip III M.D.   On: 11/16/2014 13:30    EKG: Independently reviewed. Normal sinus with significant artifact in all leads  Assessment/Plan Principal Problem:   Acute encephalopathy Active Problems:   Seizure disorder   Dementia   VP (ventriculoperitoneal) shunt status   Abnormality of gait   Acute encephalopathy - Progressive altered mental status x 1 week.  - Most likely related to UTI.  - IV Rocephin empirically, urine culture pending  Sepsis due to UTI - Febrile (101F), elevated lactic acid (2.93) and decreased mental status - Known source of infection, UTI - Treating with IV Rocephin - Blood cultures pending  Malnourished - Appears underweight with scaphoid abdomen - Consult nutrition for dietary management  Seizure - Per daughter, well controlled. Has not had a seizure in a years - Dilantin level pending.  - Continue dilantin  Dementia - Oriented to person, but not date.  - Not currently on medication.  - Monitor  VP shunt - Visualization of shunt on CT, correct placement.   Abnormality of gait - Baseline gait and balance issues, possibly related to h/o of stroke - PT/OT consult for evaluation of fall risk and to minimize deconditioning.     Diet: NPO Fluids: IVF DVT Prophylaxis: lovenox  Code Status: DNR Family Communication: Son and daughter  Disposition Plan: admit to inpatient floor  Time spent: [address] [place]  Davidmichael Zarazua M. Cruzita Lederer, MD [facility] Pager [phone]  If 7PM-7AM, please contact night-coverage [email or web address] Password [facility] 11/16/2014, 3:19 PM

## 2014-11-16 NOTE — Progress Notes (Signed)
SLP Cancellation Note  Patient Details Name: Jacob Wagner MRN: [id number] DOB: Jul 20, 1940   Cancelled treatment:       Reason Eval/Treat Not Completed: Fatigue/lethargy limiting ability to participate;Other (comment) Consult received for clinical swallow evaluation. Chart reviewed. Upon my arrival to room, I explained the reason for my visit and pt's son stated "We don't want anything done today". I explained that the doctor ordered a swallowing evaluation prior to receiving a diet. He called the pt's wife and I also spoke with her on the phone and she stated that she "did not want him eating today... But he could have his medications". Pt appears lethargic at this time anyway. Will attempt bedside swallow evaluation tomorrow. Above to RN.   Thank you,  Genene Churn, Lyle    [place] 11/16/2014, 5:04 PM

## 2014-11-16 NOTE — ED Notes (Signed)
Pt comes in from home. Pt was found unresponsive by daughter. Pt is alert to person but not place. Pt is opening eyes to voice.   IV established.

## 2014-11-16 NOTE — Progress Notes (Signed)
MEDICATION RELATED CONSULT NOTE - INITIAL   Pharmacy Consult for Dilantin IV (pt w/ AMS and currently NPO) Indication: home dosing regimen continued  Allergies  Allergen Reactions  . Aricept [Donepezil Hcl] Diarrhea  . Ciprocinonide [Fluocinolone]   . Flomax [Tamsulosin Hcl]   . Ibuprofen   . Levaquin [Levofloxacin In D5w]   . Lipitor [Atorvastatin]     dizziness  . Namenda [Memantine Hcl]     itch  . Other     Wife states NO MRI due to shunt in pts head.     Patient Measurements:    Vital Signs: Temp: 101 F (38.3 C) (07/13 1041) Temp Source: Rectal (07/13 1041) BP: 122/76 mmHg (07/13 1330) Pulse Rate: 80 (07/13 1130) Intake/Output from previous day:   Intake/Output from this shift:    Labs:  Recent Labs  11/16/14 1015  WBC 8.3  HGB 12.4*  HCT 37.2*  PLT 161  CREATININE 0.81  ALBUMIN 3.3*  PROT 6.7  AST 34  ALT 18  ALKPHOS 123  BILITOT 0.8   CrCl cannot be calculated (Unknown ideal weight.).   Microbiology: No results found for this or any previous visit (from the past 720 hour(s)).  Medical History: Past Medical History  Diagnosis Date  . Seizures   . Dementia   . Brain tumor     thalamus  . Insomnia   . Dementia   . Hypercholesteremia   . Colon cancer   . Prostate cancer   . Stroke     left temporal  . Abnormality of gait 09/20/2014  . Memory difficulties 09/20/2014   Medications:  Prescriptions prior to admission  Medication Sig Dispense Refill Last Dose  . acetaminophen (TYLENOL) 325 MG tablet Take 650 mg by mouth every 6 (six) hours as needed for mild pain, moderate pain or headache.    Past Week at Unknown time  . diphenoxylate-atropine (LOMOTIL) 2.5-0.025 MG per tablet Take 2 tablets by mouth 4 (four) times daily as needed for diarrhea or loose stools (for 5 days).   11/15/2014 at Unknown time  . lactulose (CHRONULAC) 10 GM/15ML solution Take 15-30 mLs by mouth daily as needed.  1   . phenytoin (DILANTIN) 100 MG ER capsule Take 1  capsule (100 mg total) by mouth 2 (two) times daily.   11/15/2014 at Unknown time  . silver sulfADIAZINE (SILVADENE) 1 % cream Apply 1 application topically daily.   Past Month at Unknown time  . sulfamethoxazole-trimethoprim (BACTRIM,SEPTRA) 400-80 MG per tablet Take 1 tablet by mouth 2 (two) times daily.   Past Month at Unknown time   Assessment: 74yo male brought to ED by EMS due to AMS and found unresponsive.  Dilantin level therapeutic on admission (16.2).  Albumin = 3.3.  Home dilantin dose listed above.   Goal of Therapy:  Maintain corrected dilantin level 10-20  Plan:  Dilantin 100mg  IV q12hrs Monitor labs and progress, transition to PO when feasible  Hart Robinsons A 11/16/2014,4:02 PM

## 2014-11-16 NOTE — ED Provider Notes (Addendum)
CSN: [id number]     Arrival date & time 11/16/14  5397 History   First MD Initiated Contact with Patient 11/16/14 1043     Chief Complaint  Patient presents with  . Altered Mental Status     (Consider location/radiation/quality/duration/timing/severity/associated sxs/prior Treatment) Patient is a 74 y.o. male presenting with altered mental status. The history is provided by the patient, a relative and the EMS personnel.  Altered Mental Status Presenting symptoms: confusion   Associated symptoms: fever    patient brought in by EMS from home. Family members are here. Patient was found unresponsive by daughter. Patient alert to person but not place patient will open his eyes. Patient for the past week has had increased fatigue not eating or drinking well. I then today was even less responsive. Patient does have a history of dementia patient does have a history of a shunt.  Past Medical History  Diagnosis Date  . Seizures   . Dementia   . Brain tumor     thalamus  . Insomnia   . Dementia   . Hypercholesteremia   . Colon cancer   . Prostate cancer   . Stroke     left temporal  . Abnormality of gait 09/20/2014  . Memory difficulties 09/20/2014   Past Surgical History  Procedure Laterality Date  . Prostate cancer    . Colon surgery 10 yrs ago    . Shunt to head    . Foot surgery      left ankle ORIF/ fracture  . Inguinal hernia repair Right    Family History  Problem Relation Age of Onset  . Cancer Father     lung  . Benign prostatic hyperplasia Father   . Dementia Mother   . Hypertension Mother   . Hyperlipidemia Mother   . Stomach cancer Maternal Uncle   . Bone cancer Maternal Uncle    History  Substance Use Topics  . Smoking status: Former Smoker    Quit date: 05/06/2009  . Smokeless tobacco: Not on file  . Alcohol Use: No    Review of Systems  Unable to perform ROS Constitutional: Positive for fever, activity change, appetite change and fatigue.   Psychiatric/Behavioral: Positive for confusion.   review of systems Limited by the patient's dementia. Unable to complete review of systems. Level V caveat applies.    Allergies  Aricept; Ciprocinonide; Flomax; Ibuprofen; Levaquin; Lipitor; Namenda; and Other  Home Medications   Prior to Admission medications   Medication Sig Start Date End Date Taking? Authorizing Provider  acetaminophen (TYLENOL) 325 MG tablet Take 650 mg by mouth every 6 (six) hours as needed for mild pain, moderate pain or headache.    Yes Historical Provider, MD  diphenoxylate-atropine (LOMOTIL) 2.5-0.025 MG per tablet Take 2 tablets by mouth 4 (four) times daily as needed for diarrhea or loose stools (for 5 days).   Yes Historical Provider, MD  lactulose (CHRONULAC) 10 GM/15ML solution Take 15-30 mLs by mouth daily as needed. 11/04/14  Yes Historical Provider, MD  phenytoin (DILANTIN) 100 MG ER capsule Take 1 capsule (100 mg total) by mouth 2 (two) times daily. 10/26/14  Yes Kathrynn Ducking, MD  silver sulfADIAZINE (SILVADENE) 1 % cream Apply 1 application topically daily.   Yes Historical Provider, MD  sulfamethoxazole-trimethoprim (BACTRIM,SEPTRA) 400-80 MG per tablet Take 1 tablet by mouth 2 (two) times daily.   Yes Historical Provider, MD   BP 122/76 mmHg  Pulse 80  Temp(Src) 101 F (38.3 C) (Rectal)  Resp 19  SpO2 96% Physical Exam  Constitutional: He appears well-developed and well-nourished. No distress.  HENT:  Head: Normocephalic and atraumatic.  Eyes: Conjunctivae and EOM are normal. Pupils are equal, round, and reactive to light.  Neck: Normal range of motion. Neck supple.  Cardiovascular: Normal rate, regular rhythm and normal heart sounds.   Pulmonary/Chest: Effort normal and breath sounds normal. No respiratory distress.  Abdominal: Soft. Bowel sounds are normal. There is no tenderness.  Musculoskeletal: Normal range of motion. He exhibits no edema.  Neurological: He is alert. No cranial nerve  deficit. He exhibits normal muscle tone. Coordination normal.  Skin: Skin is warm. No rash noted.  Nursing note and vitals reviewed.   ED Course  Procedures (including critical care time) Labs Review Labs Reviewed  COMPREHENSIVE METABOLIC PANEL - Abnormal; Notable for the following:    Chloride 97 (*)    Glucose, Bld 116 (*)    Calcium 8.3 (*)    Albumin 3.3 (*)    All other components within normal limits  CBC - Abnormal; Notable for the following:    RBC 4.21 (*)    Hemoglobin 12.4 (*)    HCT 37.2 (*)    All other components within normal limits  URINALYSIS, ROUTINE W REFLEX MICROSCOPIC (NOT AT [facility]) - Abnormal; Notable for the following:    APPearance CLOUDY (*)    Hgb urine dipstick LARGE (*)    Ketones, ur TRACE (*)    Protein, ur 30 (*)    Urobilinogen, UA 4.0 (*)    Nitrite POSITIVE (*)    Leukocytes, UA MODERATE (*)    All other components within normal limits  URINE MICROSCOPIC-ADD ON - Abnormal; Notable for the following:    Bacteria, UA MANY (*)    All other components within normal limits  I-STAT CG4 LACTIC ACID, ED - Abnormal; Notable for the following:    Lactic Acid, Venous 2.93 (*)    All other components within normal limits  CULTURE, BLOOD (ROUTINE X 2)  CULTURE, BLOOD (ROUTINE X 2)  URINE CULTURE  PHENYTOIN LEVEL, TOTAL  CBG MONITORING, ED  CBG MONITORING, ED   Results for orders placed or performed during the hospital encounter of 11/16/14  Comprehensive metabolic panel  Result Value Ref Range   Sodium 136 135 - 145 mmol/L   Potassium 3.8 3.5 - 5.1 mmol/L   Chloride 97 (L) 101 - 111 mmol/L   CO2 31 22 - 32 mmol/L   Glucose, Bld 116 (H) 65 - 99 mg/dL   BUN 16 6 - 20 mg/dL   Creatinine, Ser 0.81 0.61 - 1.24 mg/dL   Calcium 8.3 (L) 8.9 - 10.3 mg/dL   Total Protein 6.7 6.5 - 8.1 g/dL   Albumin 3.3 (L) 3.5 - 5.0 g/dL   AST 34 15 - 41 U/L   ALT 18 17 - 63 U/L   Alkaline Phosphatase 123 38 - 126 U/L   Total Bilirubin 0.8 0.3 - 1.2 mg/dL   GFR  calc non Af Amer >60 >60 mL/min   GFR calc Af Amer >60 >60 mL/min   Anion gap 8 5 - 15  CBC  Result Value Ref Range   WBC 8.3 4.0 - 10.5 K/uL   RBC 4.21 (L) 4.22 - 5.81 MIL/uL   Hemoglobin 12.4 (L) 13.0 - 17.0 g/dL   HCT 37.2 (L) 39.0 - 52.0 %   MCV 88.4 78.0 - 100.0 fL   MCH 29.5 26.0 - 34.0 pg   MCHC  33.3 30.0 - 36.0 g/dL   RDW 13.6 11.5 - 15.5 %   Platelets 161 150 - 400 K/uL  Urinalysis, Routine w reflex microscopic (not at [facility])  Result Value Ref Range   Color, Urine YELLOW YELLOW   APPearance CLOUDY (A) CLEAR   Specific Gravity, Urine 1.020 1.005 - 1.030   pH 7.5 5.0 - 8.0   Glucose, UA NEGATIVE NEGATIVE mg/dL   Hgb urine dipstick LARGE (A) NEGATIVE   Bilirubin Urine NEGATIVE NEGATIVE   Ketones, ur TRACE (A) NEGATIVE mg/dL   Protein, ur 30 (A) NEGATIVE mg/dL   Urobilinogen, UA 4.0 (H) 0.0 - 1.0 mg/dL   Nitrite POSITIVE (A) NEGATIVE   Leukocytes, UA MODERATE (A) NEGATIVE  Urine microscopic-add on  Result Value Ref Range   Squamous Epithelial / LPF RARE RARE   WBC, UA TOO NUMEROUS TO COUNT <3 WBC/hpf   RBC / HPF 7-10 <3 RBC/hpf   Bacteria, UA MANY (A) RARE  CBG monitoring, ED  Result Value Ref Range   Glucose-Capillary 99 65 - 99 mg/dL  I-Stat CG4 Lactic Acid, ED  Result Value Ref Range   Lactic Acid, Venous 2.93 (HH) 0.5 - 2.0 mmol/L   Comment NOTIFIED PHYSICIAN      Imaging Review Dg Chest 1 View  11/16/2014   CLINICAL DATA:  Fever and altered mental status. History of colon and prostate carcinoma  EXAM: CHEST  1 VIEW  COMPARISON:  October 17, 2011  FINDINGS: There is persistent elevation of the left hemidiaphragm. There is a calcified granuloma in the right base. There is atelectatic change in the left base. The lungs elsewhere clear. Heart is upper normal in size with pulmonary vascularity within normal limits. No adenopathy. A shunt extends along the anterior right hemithorax.  IMPRESSION: Atelectasis left base. Stable elevation left hemidiaphragm. Calcified  granuloma right base. No edema or consolidation. No change in cardiac silhouette.   Electronically Signed   By: Lowella Grip III M.D.   On: 11/16/2014 13:16   Ct Head Wo Contrast  11/16/2014   CLINICAL DATA:  Altered mental status and fever. History of seizures. Found unresponsive.  EXAM: CT HEAD WITHOUT CONTRAST  TECHNIQUE: Contiguous axial images were obtained from the base of the skull through the vertex without intravenous contrast.  COMPARISON:  Sep 26, 2014  FINDINGS: There is moderate atrophy which is stable. The ventricular shunt catheter tip passes through the lateral ventricles and resides to the left of the third ventricle slightly superior to the left suprasellar cistern. This is a stable finding. There is no intracranial mass, hemorrhage, extra-axial fluid collection, or midline shift. There is evidence of prior infarcts in the right frontal and left temporal lobes, stable. There is evidence of prior infarction throughout the rostrum of the corpus callosum, stable. There is small vessel disease throughout the centra semiovale bilaterally. There is no new gray-white compartment lesion. There is no acute infarct evident. There is no intracranial mass, hemorrhage, or extra-axial fluid collection. There is a shunt catheter defect in the right frontal bone. Bony structures otherwise appear intact. The mastoid air cells are clear.  IMPRESSION: Shunt catheter with the tip just superior to the left suprasellar cisterna lateral to the third ventricle. Prior infarcts and supratentorial small vessel disease, stable. Underlying atrophy, stable. No mass, hemorrhage, or evidence suggesting acute infarct.   Electronically Signed   By: Lowella Grip III M.D.   On: 11/16/2014 13:30     EKG Interpretation   Date/Time:  Wednesday November 16 2014 10:11:19 EDT Ventricular Rate:  81 PR Interval:  64 QRS Duration: 100 QT Interval:  409 QTC Calculation: 475 R Axis:   -77 Text Interpretation:  Sinus rhythm  Short PR interval Left axis deviation  Abnormal R-wave progression, early transition Nonspecific T abnormalities,  lateral leads Artifact in lead(s) I II III aVR aVL aVF V1 V2 V4 V5 V6  Confirmed by Mandi Mattioli  MD, Talitha Dicarlo ([id number]) on 11/16/2014 1:38:37 PM      MDM   Final diagnoses:  Altered mental status  Fever  UTI (lower urinary tract infection)   Patient has a history of dementia but family states the more lethargic than usual for the past week though activity not wanting D drink much. Workup here shows evidence of a sniffing urinary tract infection along with the fever. May be a bit of urosepsis. Lactic acid is somewhat elevated however heart rate and blood pressure is fine no hypotension. Patient does have blood cultures pending urine culture sent started with IV Rocephin for the urinary tract infection. No leukocytosis head CT negative chest x-ray negative for pneumonia. Discussed with hospitalist patient reasonably stable improves some with fluids here will be admitted to the medical floor.  Patient CT of the head with the shunt shows no abnormalities there. Patient is on Dilantin and Dilantin level is pending.  Fredia Sorrow, MD 11/16/14 [place], MD 11/16/14 [phone]

## 2014-11-16 NOTE — ED Notes (Signed)
POC CBG result: 99

## 2014-11-17 DIAGNOSIS — E43 Unspecified severe protein-calorie malnutrition: Secondary | ICD-10-CM | POA: Diagnosis present

## 2014-11-17 LAB — BASIC METABOLIC PANEL
Anion gap: 9 (ref 5–15)
BUN: 12 mg/dL (ref 6–20)
CHLORIDE: 102 mmol/L (ref 101–111)
CO2: 27 mmol/L (ref 22–32)
Calcium: 7.8 mg/dL — ABNORMAL LOW (ref 8.9–10.3)
Creatinine, Ser: 0.61 mg/dL (ref 0.61–1.24)
GFR calc non Af Amer: >60 mL/min (ref >60)
Glucose, Bld: 97 mg/dL (ref 65–99)
Potassium: 3.2 mmol/L — ABNORMAL LOW (ref 3.5–5.1)
SODIUM: 138 mmol/L (ref 135–145)

## 2014-11-17 LAB — CBC
HCT: 33.4 % — ABNORMAL LOW (ref 39.0–52.0)
Hemoglobin: 11.3 g/dL — ABNORMAL LOW (ref 13.0–17.0)
MCH: 29.7 pg (ref 26.0–34.0)
MCHC: 33.8 g/dL (ref 30.0–36.0)
MCV: 87.9 fL (ref 78.0–100.0)
Platelets: 151 10*3/uL (ref 150–400)
RBC: 3.8 MIL/uL — AB (ref 4.22–5.81)
RDW: 13.6 % (ref 11.5–15.5)
WBC: 9.6 10*3/uL (ref 4.0–10.5)

## 2014-11-17 MED ORDER — BOOST / RESOURCE BREEZE PO LIQD
1.0000 | Freq: Three times a day (TID) | ORAL | Status: DC
  Administered 2014-11-17 – 2014-11-19 (×6): 1 via ORAL

## 2014-11-17 MED ORDER — POTASSIUM CHLORIDE CRYS ER 20 MEQ PO TBCR: 30.0000 meq | EXTENDED_RELEASE_TABLET | Freq: Once | ORAL | Status: DC

## 2014-11-17 MED ORDER — POTASSIUM CHLORIDE 10 MEQ/100ML IV SOLN
10.0000 meq | INTRAVENOUS | Status: AC
  Administered 2014-11-17 (×3): 10 meq via INTRAVENOUS
  Filled 2014-11-17: qty 100

## 2014-11-17 NOTE — Progress Notes (Signed)
PROGRESS NOTE  Jacob Wagner LSL:[id number] DOB: 05-18-1940 DOA: 11/16/2014 PCP: Jacob Wagner., MD   HPI: Patient is a 74 year old man with past medical history significant for dementia, seizures, stroke, hypercholesterolemia was admitted 11/16/14 for one week progressive change in behavior with decreased appetite, drinking, and difficulty voiding. In the ED, patient was found to have a UTI.   Subjective / 24 H Interval events Family reports patient has mostly been sleeping since admission with a few moments of wakefulness and speaking. Today, patient woke up on command. Denied any problems.    Assessment/Plan: Principal Problem:   Acute encephalopathy Active Problems:   Seizure disorder   Dementia   VP (ventriculoperitoneal) shunt status   Abnormality of gait   Acute encephalopathy - Progressive altered mental status x 1 week most likely related to UTI.  - Arousable today, oriented to self and family, improved when compared to ED.  - Continue IV Rocephin, cultures pending, monitor   Sepsis due to UTI - On admission Febrile (101F), elevated lactic acid (2.93) and decreased mental status, known source of infection (UTI) - Today he is afebrile, some low BP readings (100s/50s), but otherwise stable vital signs - Continue IV Rocephin; cultures pending  Malnourished - Appears underweight with scaphoid abdomen - Consult nutrition  Seizure - Dilantin level 16.2 - Stable. Continue dilantin per pharmacy  Dementia - Oriented to person and family, but not date.  - Not currently on medication. Monitor - Speech and language pathology for clinical swallowing evaluation  VP shunt - Visualization of shunt on CT, correct placement.   Abnormality of gait - Baseline gait and balance issues, possibly related to h/o of stroke - PT/OT consult for evaluation of fall risk and to minimize deconditioning.    Diet: Diet NPO time specified Except for: Sips with Meds Fluids:  IVF DVT Prophylaxis: lovenox  Code Status: DNR Family Communication: self and children, discussed with wife over the phone Jacob Wagner, [phone]) Disposition Plan: continue inpatient admission  Consultants:  PT/OT  Speech and language Pathology  Procedures:  None  Antibiotics  Rocephin>>7/13    Studies  Dg Chest 1 View  11/16/2014   CLINICAL DATA:  Fever and altered mental status. History of colon and prostate carcinoma  EXAM: CHEST  1 VIEW  COMPARISON:  October 17, 2011  FINDINGS: There is persistent elevation of the left hemidiaphragm. There is a calcified granuloma in the right base. There is atelectatic change in the left base. The lungs elsewhere clear. Heart is upper normal in size with pulmonary vascularity within normal limits. No adenopathy. A shunt extends along the anterior right hemithorax.  IMPRESSION: Atelectasis left base. Stable elevation left hemidiaphragm. Calcified granuloma right base. No edema or consolidation. No change in cardiac silhouette.   Electronically Signed   By: Lowella Grip III M.D.   On: 11/16/2014 13:16   Ct Head Wo Contrast  11/16/2014   CLINICAL DATA:  Altered mental status and fever. History of seizures. Found unresponsive.  EXAM: CT HEAD WITHOUT CONTRAST  TECHNIQUE: Contiguous axial images were obtained from the base of the skull through the vertex without intravenous contrast.  COMPARISON:  Sep 26, 2014  FINDINGS: There is moderate atrophy which is stable. The ventricular shunt catheter tip passes through the lateral ventricles and resides to the left of the third ventricle slightly superior to the left suprasellar cistern. This is a stable finding. There is no intracranial mass, hemorrhage, extra-axial fluid collection, or midline shift. There is evidence of  prior infarcts in the right frontal and left temporal lobes, stable. There is evidence of prior infarction throughout the rostrum of the corpus callosum, stable. There is small vessel disease  throughout the centra semiovale bilaterally. There is no new gray-white compartment lesion. There is no acute infarct evident. There is no intracranial mass, hemorrhage, or extra-axial fluid collection. There is a shunt catheter defect in the right frontal bone. Bony structures otherwise appear intact. The mastoid air cells are clear.  IMPRESSION: Shunt catheter with the tip just superior to the left suprasellar cisterna lateral to the third ventricle. Prior infarcts and supratentorial small vessel disease, stable. Underlying atrophy, stable. No mass, hemorrhage, or evidence suggesting acute infarct.   Electronically Signed   By: Lowella Grip III M.D.   On: 11/16/2014 13:30    Objective  Filed Vitals:   11/16/14 1330 11/16/14 1639 11/16/14 2212 11/17/14 0649  BP: 122/76 104/49 100/59 100/52  Pulse:  87 61 61  Temp:  98.9 F (37.2 C) 98.7 F (37.1 C) 98.2 F (36.8 C)  TempSrc:  Oral Oral Oral  Resp: 19  20 18   Height:  6\' 2"  (1.88 m)    Weight:  57.788 kg (127 lb 6.4 oz)    SpO2:  98% 97% 99%    Intake/Output Summary (Last 24 hours) at 11/17/14 1225 Last data filed at 11/17/14 0700  Gross per 24 hour  Intake    900 ml  Output    275 ml  Net    625 ml   Filed Weights   11/16/14 1639  Weight: 57.788 kg (127 lb 6.4 oz)    Exam:  General: Laying bed, arouasable, alert to self and family, in no acute distress.   HEENT: no scleral icterus, PERRL  Cardiovascular: RRR without MRG, 2+ peripheral pulses, no edema  Respiratory: CTA biL, good air movement, no wheezing, no crackles, no rales  Abdomen: scaphoid abdomen, soft, non tender, BS +, no guarding  MSK/Extremities: no clubbing/cyanosis, no joint swelling  Skin: no rashes, diffuse dry skin, healing ulcer on 2nd left toe   Data Reviewed: Basic Metabolic Panel:  Recent Labs Lab 11/16/14 1015 11/17/14 0612  NA 136 138  K 3.8 3.2*  CL 97* 102  CO2 31 27  GLUCOSE 116* 97  BUN 16 12  CREATININE 0.81 0.61  CALCIUM  8.3* 7.8*   Liver Function Tests:  Recent Labs Lab 11/16/14 1015  AST 34  ALT 18  ALKPHOS 123  BILITOT 0.8  PROT 6.7  ALBUMIN 3.3*   CBC:  Recent Labs Lab 11/16/14 1015 11/17/14 0612  WBC 8.3 9.6  HGB 12.4* 11.3*  HCT 37.2* 33.4*  MCV 88.4 87.9  PLT 161 151   CBG:  Recent Labs Lab 11/16/14 1035  GLUCAP 99   Recent Results (from the past 240 hour(s))  Culture, blood (routine x 2)     Status: None (Preliminary result)   Collection Time: 11/16/14 10:23 AM  Result Value Ref Range Status   Specimen Description BLOOD RIGHT ANTECUBITAL  Final   Special Requests BOTTLES DRAWN AEROBIC ONLY 8CC  Final   Culture NO GROWTH < 24 HOURS  Final   Report Status PENDING  Incomplete  Urine culture     Status: None (Preliminary result)   Collection Time: 11/16/14 12:20 PM  Result Value Ref Range Status   Specimen Description URINE, CATHETERIZED  Final   Special Requests NONE  Final   Culture   Final    >=100,000 COLONIES/mL Lonell Grandchild  NEGATIVE RODS CULTURE REINCUBATED FOR BETTER GROWTH Performed at [facility]    Report Status PENDING  Incomplete  Culture, blood (routine x 2)     Status: None (Preliminary result)   Collection Time: 11/16/14  1:24 PM  Result Value Ref Range Status   Specimen Description BLOOD LEFT HAND  Final   Special Requests BOTTLES DRAWN AEROBIC ONLY 4CC  Final   Culture NO GROWTH < 24 HOURS  Final   Report Status PENDING  Incomplete     Scheduled Meds: . cefTRIAXone (ROCEPHIN)  IV  1 g Intravenous Q24H  . enoxaparin (LOVENOX) injection  40 mg Subcutaneous Q24H  . phenytoin (DILANTIN) IV  100 mg Intravenous Q12H  . potassium chloride  10 mEq Intravenous Q1 Hr x 3  . silver sulfADIAZINE  1 application Topical Daily  . sodium chloride  3 mL Intravenous Q12H   Continuous Infusions: . sodium chloride 75 mL/hr at 11/16/14 1239  . sodium chloride 75 mL/hr at 11/16/14 2223   Gay Filler, PA-Student  Marzetta Board, MD [facility] Pager  [phone]. If 7 PM - 7 AM, please contact night-coverage at [email or web address], password [facility] 11/17/2014, 12:25 PM  LOS: 1 day

## 2014-11-17 NOTE — Progress Notes (Signed)
Dr. Cruzita Lederer ordered to discontinue telemetry.

## 2014-11-17 NOTE — Progress Notes (Signed)
PT Cancellation Note  Patient Details Name: Jacob Wagner MRN: [id number] DOB: 03/09/1941   Cancelled Treatment:    Reason Eval/Treat Not Completed: Other (comment).  Pt was sleeping and children at bedside.  I described my reason for being there but children refused to allow me to try to awaken pt to do an evaluation.  They state that he will only do what he wants to do and cannot be forced.  They also are concerned about his infection and feel that he needs to rest.  I told them that I would return tomorrow.   Demetrios Isaacs L 11/17/2014, 10:08 AM

## 2014-11-17 NOTE — Care Management Note (Signed)
Case Management Note  Patient Details  Name: Jacob Wagner MRN: [id number] Date of Birth: 04-25-1941  Expected Discharge Date:                  Expected Discharge Plan:  Home/Self Care  In-House Referral:  NA  Discharge planning Services  CM Consult  Post Acute Care Choice:  NA Choice offered to:  NA  DME Arranged:    DME Agency:     HH Arranged:    HH Agency:     Status of Service:  In process, will continue to follow  Medicare Important Message Given:    Date Medicare IM Given:    Medicare IM give by:    Date Additional Medicare IM Given:    Additional Medicare Important Message give by:     If discussed at [place] of Stay Meetings, dates discussed:    Additional Comments: Pt is from home, lives with wife, [name] and daughter in [place]. Pt has dementia and is fairly independent at baseline. Pt ambulates independently but has walker, wheelchair and BSC if he needs them. Pt's wife does not want patient woke up to work with PT. Per son, pt's baseline is to some days sleep all day and some days he gets up and walker around. Pt memory and cognition is very poor due to his dementia. Per family, pt's wife wants for staff to let him sleep and not to disturb him. Pt's son reports the patients wife does not want any HH services at discharge. CM will cont to follow patients progress and will re-address [facility] directly with the wife when appropriate, if needed.   Sherald Barge, RN 11/17/2014, 1:10 PM

## 2014-11-17 NOTE — Progress Notes (Signed)
Initial Nutrition Assessment  DOCUMENTATION CODES:   Severe malnutrition in context of chronic illness   Underweight  INTERVENTION:  Boost Breeze po TID, each supplement provides 250 kcal and 9 grams of protein   RD will continue to follow   NUTRITION DIAGNOSIS:   Inadequate oral intake related to poor appetite, acute and chronic  illness as evidenced by meal completion < 25% and severe wt loss over the past 2+ years.   GOAL:   Patient will meet greater than or equal to 90% of their needs   MONITOR:   PO intake, Supplement acceptance, Labs, Weight trends  REASON FOR ASSESSMENT:   Consult Assessment of nutrition requirement/status  ASSESSMENT:   Pt has hx of dementia. His family are present and report very poor food and beverage intake for > 7 days. At baseline he is unable to feed himself. His wt has decreased 12% in the past 2 months per daughter. At home he has been eating soft foods such as jellp, bananas, mashed potatoes. According to family  he is lactose intolerant. and does not drink milk or Ensure type supplelments.    Diet Order:  DIET - DYS 1 Room service appropriate?: Yes; Fluid consistency:: Thin  Skin:   WDL  Last BM:   7/14  Height:   Ht Readings from Last 1 Encounters:  11/16/14 6\' 2"  (1.88 m)    Weight:   Wt Readings from Last 1 Encounters:  11/16/14 127 lb 6.4 oz (57.788 kg)    Ideal Body Weight:   86.3 kg  Wt Readings from Last 10 Encounters:  11/16/14 127 lb 6.4 oz (57.788 kg)  03/23/13 178 lb 12.7 oz (81.1 kg)  04/16/12 204 lb 11.2 oz (92.851 kg)    BMI:  Body mass index is 16.35 kg/(m^2).  Estimated Nutritional Needs:   Kcal:  1900-2200  Protein:  75-85 gr  Fluid:  1800 ml daily  EDUCATION NEEDS: none identified     Colman Cater MS,RD,CSG,LDN Office: [phone] Pager: [phone]

## 2014-11-17 NOTE — Evaluation (Signed)
Clinical/Bedside Swallow Evaluation Patient Details  Name: Jacob Wagner MRN: [id number] Date of Birth: 01/01/1941  Today's Date: 11/17/2014 Time: SLP Start Time (ACUTE ONLY): 0148 SLP Stop Time (ACUTE ONLY): 0215 SLP Time Calculation (min) (ACUTE ONLY): 27 min  Past Medical History:  Past Medical History  Diagnosis Date  . Seizures   . Dementia   . Brain tumor     thalamus  . Insomnia   . Dementia   . Hypercholesteremia   . Colon cancer   . Prostate cancer   . Stroke     left temporal  . Abnormality of gait 09/20/2014  . Memory difficulties 09/20/2014   Past Surgical History:  Past Surgical History  Procedure Laterality Date  . Prostate cancer    . Colon surgery 10 yrs ago    . Shunt to head    . Foot surgery      left ankle ORIF/ fracture  . Inguinal hernia repair Right    HPI:  Pt is a 74 y.o. male has a past medical history significant for dementia, seizures, stroke, hypercholesterolemia. History was provided by his son and daughter. Family noticed patient has not been acting like himself progressively worsening for the past week. Reported the altered mental status has been worsening and patient is no longer eating or drinking well. CXR negative for pneumonia. CT brain negative with visualization of shunt and negative for acute intracranial abnormalities. Placed on IV Rocephin for UTI. No known history of multi-drug resistant organisms. Patient is admitted for further evaluation and treatment of UTI and acute encephalopathy   Assessment / Plan / Recommendation Clinical Impression  Pt presents with mild to moderate oral dysphagia likely impacted by recent worsening of mentation. Note patient with baseline cognitive impairements inclusive of dementia. Pt requried consistent cueing and feeding assistance during PO trials for maintaining alert state, however appeared to be adequately protecting his airway. Orally patient displayed prolonged mastication, reduced lingual  coordination of bolus, and delay in AP transfer with solid consistencies. No overt signs or symptoms of aspiration with any consistencies. Family reports pt baseline eating habits require total feeding assistance. Recommend dysphagia 1 (puree) diet with thin liquids. Feeding assistance and full supervision during meals, medicines whole with puree. ST to follow up for diet tolerance and upgraded PO trials.      Aspiration Risk  Mild    Diet Recommendation Dysphagia 1 (Puree);Thin   Medication Administration: Whole meds with puree Compensations: Minimize environmental distractions;Slow rate;Small sips/bites;Follow solids with liquid    Other  Recommendations Oral Care Recommendations: Oral care BID   Follow Up Recommendations       Frequency and Duration min 2x/week  2 weeks   Pertinent Vitals/Pain Denies pain    SLP Swallow Goals     Swallow Study Prior Functional Status       General Date of Onset: 11/16/14 Other Pertinent Information: Pt is a 75 y.o. male has a past medical history significant for dementia, seizures, stroke, hypercholesterolemia. History was provided by his son and daughter. Family noticed patient has not been acting like himself progressively worsening for the past week. Reported the altered mental status has been worsening and patient is no longer eating or drinking well. CXR negative for pneumonia. CT brain negative with visualization of shunt and negative for acute intracranial abnormalities. Placed on IV Rocephin for UTI. No known history of multi-drug resistant organisms. Patient is admitted for further evaluation and treatment of UTI and acute encephalopathy Type of Study: Bedside swallow  evaluation Diet Prior to this Study: NPO Temperature Spikes Noted: Yes Respiratory Status: Room air History of Recent Intubation: No Behavior/Cognition: Lethargic/Drowsy;Requires cueing;Cooperative Oral Cavity - Dentition: Edentulous (Pt has dentures at home which family  states he uses) Self-Feeding Abilities: Needs assist Patient Positioning: Upright in bed Baseline Vocal Quality: Low vocal intensity Volitional Cough: Weak Volitional Swallow: Unable to elicit    Oral/Motor/Sensory Function Overall Oral Motor/Sensory Function: Impaired at baseline Labial ROM: Reduced right;Reduced left Labial Symmetry: Within Functional Limits Labial Strength: Reduced Lingual ROM: Reduced right;Reduced left Lingual Symmetry: Within Functional Limits Lingual Strength: Reduced Facial ROM: Reduced right;Reduced left Facial Symmetry: Within Functional Limits Facial Strength: Reduced   Ice Chips Ice chips: Within functional limits Presentation: Spoon   Thin Liquid Thin Liquid: Within functional limits Presentation: Straw;Spoon;Cup    Nectar Thick Nectar Thick Liquid: Not tested   Honey Thick Honey Thick Liquid: Not tested   Puree Puree: Within functional limits   Solid   GO    Solid: Impaired Oral Phase Impairments: Reduced lingual movement/coordination;Impaired mastication;Impaired anterior to posterior transit      Arvil Chaco MA, [facility] Language Pathologist    Levi Aland 11/17/2014,2:30 PM

## 2014-11-18 NOTE — Care Management Important Message (Signed)
Important Message  Patient Details  Name: TAVORIS BRISK MRN: [id number] Date of Birth: 1941-01-01   Medicare Important Message Given:  Yes-second notification given    Sherald Barge, RN 11/18/2014, 2:37 PM

## 2014-11-18 NOTE — Progress Notes (Signed)
Speech Language Pathology Treatment:    Patient Details Name: Jacob Wagner MRN: [id number] DOB: Oct 29, 1940 Today's Date: 11/18/2014 Time: 4417-1278 SLP Time Calculation (min) (ACUTE ONLY): 25 min  Assessment / Plan / Recommendation Clinical Impression  SLP assisted in feeding for pt consumption of puree and thin liquid consistencies with no overt signs or symptoms of aspiration. Pts initation of swallowing appearing timely with adequate protection of airway. Family reports increase in intake since admission and good tolerance of current dysphagia 1 and thin liquid diet. Recommend full feeding assistance at all meals and continued aspiration precautions. No further ST services indicated. ST to sign off. Reconsult if any changes in swallowing are observed in the future.     HPI Other Pertinent Information: Pt is a 74 y.o. male has a past medical history significant for dementia, seizures, stroke, hypercholesterolemia. History was provided by his son and daughter. Family noticed patient has not been acting like himself progressively worsening for the past week. Reported the altered mental status has been worsening and patient is no longer eating or drinking well. CXR negative for pneumonia. CT brain negative with visualization of shunt and negative for acute intracranial abnormalities. Placed on IV Rocephin for UTI. No known history of multi-drug resistant organisms. Patient is admitted for further evaluation and treatment of UTI and acute encephalopathy   Pertinent Vitals Pain Assessment: No/denies pain  SLP Plan  All goals met    Recommendations Diet recommendations: Dysphagia 1 (puree);Thin liquid Liquids provided via: Straw;Cup Medication Administration: Whole meds with liquid Supervision: Full supervision/cueing for compensatory strategies;Staff to assist with self feeding Compensations: Minimize environmental distractions;Slow rate;Small sips/bites;Follow solids with liquid Postural  Changes and/or Swallow Maneuvers: Seated upright 90 degrees              Oral Care Recommendations: Oral care BID Plan: All goals met    GO    Arvil Chaco MA, CCC-SLP Acute Care Speech Language Pathologist     Levi Aland 11/18/2014, 3:54 PM

## 2014-11-18 NOTE — Care Management Note (Signed)
Case Management Note  Patient Details  Name: BRETON BERNS MRN: [id number] Date of Birth: 30-Jul-1940  Expected Discharge Date:    11/19/2014              Expected Discharge Plan:  Home/Self Care  In-House Referral:  NA  Discharge planning Services  CM Consult  Post Acute Care Choice:  NA Choice offered to:  NA  DME Arranged:    DME Agency:     HH Arranged:    [place] Agency:     Status of Service:  Completed, signed off  Medicare Important Message Given:  Yes-second notification given Date Medicare IM Given:    Medicare IM give by:    Date Additional Medicare IM Given:    Additional Medicare Important Message give by:     If discussed at [place] of Stay Meetings, dates discussed:    Additional Comments: MD anticipates DC in next 24 hours. Pt more alert today but not communicative. Pt's wife and family continue to refuse [facility] services. Pt will DC home with self/family care. No CM needs anticipated. Sherald Barge, RN 11/18/2014, 2:38 PM

## 2014-11-18 NOTE — Progress Notes (Signed)
PROGRESS NOTE  Jacob Wagner ESP:[id number] DOB: May 09, 1940 DOA: 11/16/2014 PCP: Glo Herring., MD   HPI: Patient is a 74 year old man with past medical history significant for dementia, seizures, stroke, hypercholesterolemia was admitted 11/16/14 for one week progressive change in behavior with decreased appetite, drinking, and difficulty voiding. In the ED, patient was found to have a UTI.   Subjective / 24 H Interval events - patient alert, awake, no complaints, confused  Assessment/Plan: Principal Problem:   Acute encephalopathy Active Problems:   Seizure disorder   Dementia   VP (ventriculoperitoneal) shunt status   Abnormality of gait   Protein-calorie malnutrition, severe   Acute encephalopathy - Progressive altered mental status x 1 week most likely related to UTI.  - much improved today, clsoe to baseline - Continue IV Rocephin, cultures pending, monitor   Sepsis due to UTI - Continue IV Rocephin; cultures pending  Malnourished - Appears underweight with scaphoid abdomen - Consult nutrition  Seizure - Dilantin level 16.2 - Stable. Continue dilantin per pharmacy  Dementia - Oriented to person and family, but not date.  - Not currently on medication. Monitor - Speech and language pathology for clinical swallowing evaluation  VP shunt - Visualization of shunt on CT, correct placement.   Abnormality of gait - Baseline gait and balance issues, possibly related to h/o of stroke - PT/OT consult for evaluation of fall risk and to minimize deconditioning.    Diet: DIET - DYS 1 Room service appropriate?: Yes; Fluid consistency:: Thin Fluids: IVF DVT Prophylaxis: lovenox  Code Status: DNR Family Communication: self and children, discussed with wife over the phone [name], [phone]) Disposition Plan: home once urine cultures back  Consultants:  PT/OT  Speech and language  Pathology  Procedures:  None  Antibiotics  Rocephin>>7/13    Studies  Dg Chest 1 View  11/16/2014   CLINICAL DATA:  Fever and altered mental status. History of colon and prostate carcinoma  EXAM: CHEST  1 VIEW  COMPARISON:  October 17, 2011  FINDINGS: There is persistent elevation of the left hemidiaphragm. There is a calcified granuloma in the right base. There is atelectatic change in the left base. The lungs elsewhere clear. Heart is upper normal in size with pulmonary vascularity within normal limits. No adenopathy. A shunt extends along the anterior right hemithorax.  IMPRESSION: Atelectasis left base. Stable elevation left hemidiaphragm. Calcified granuloma right base. No edema or consolidation. No change in cardiac silhouette.   Electronically Signed   By: Lowella Grip III M.D.   On: 11/16/2014 13:16   Ct Head Wo Contrast  11/16/2014   CLINICAL DATA:  Altered mental status and fever. History of seizures. Found unresponsive.  EXAM: CT HEAD WITHOUT CONTRAST  TECHNIQUE: Contiguous axial images were obtained from the base of the skull through the vertex without intravenous contrast.  COMPARISON:  Sep 26, 2014  FINDINGS: There is moderate atrophy which is stable. The ventricular shunt catheter tip passes through the lateral ventricles and resides to the left of the third ventricle slightly superior to the left suprasellar cistern. This is a stable finding. There is no intracranial mass, hemorrhage, extra-axial fluid collection, or midline shift. There is evidence of prior infarcts in the right frontal and left temporal lobes, stable. There is evidence of prior infarction throughout the rostrum of the corpus callosum, stable. There is small vessel disease throughout the centra semiovale bilaterally. There is no new gray-white compartment lesion. There is no acute infarct evident. There is no intracranial  mass, hemorrhage, or extra-axial fluid collection. There is a shunt catheter defect in the  right frontal bone. Bony structures otherwise appear intact. The mastoid air cells are clear.  IMPRESSION: Shunt catheter with the tip just superior to the left suprasellar cisterna lateral to the third ventricle. Prior infarcts and supratentorial small vessel disease, stable. Underlying atrophy, stable. No mass, hemorrhage, or evidence suggesting acute infarct.   Electronically Signed   By: Lowella Grip III M.D.   On: 11/16/2014 13:30    Objective  Filed Vitals:   11/17/14 0649 11/17/14 1343 11/17/14 2211 11/18/14 0453  BP: 100/52 92/56 96/58  111/64  Pulse: 61 58 66 60  Temp: 98.2 F (36.8 C) 97.5 F (36.4 C) 97.8 F (36.6 C) 97.8 F (36.6 C)  TempSrc: Oral Oral Oral Oral  Resp: 18 18 18 18   Height:      Weight:      SpO2: 99% 99% 100% 99%    Intake/Output Summary (Last 24 hours) at 11/18/14 1133 Last data filed at 11/18/14 0800  Gross per 24 hour  Intake    240 ml  Output    250 ml  Net    -10 ml   Filed Weights   11/16/14 1639  Weight: 57.788 kg (127 lb 6.4 oz)    Exam:  General: Laying bed, arouasable, alert to self and family, in no acute distress.   HEENT: no scleral icterus, PERRL  Cardiovascular: RRR without MRG, 2+ peripheral pulses, no edema  Respiratory: CTA biL, good air movement, no wheezing, no crackles, no rales  Abdomen: scaphoid abdomen, soft, non tender, BS +, no guarding  MSK/Extremities: no clubbing/cyanosis, no joint swelling  Skin: no rashes, diffuse dry skin, healing ulcer on 2nd left toe   Data Reviewed: Basic Metabolic Panel:  Recent Labs Lab 11/16/14 1015 11/17/14 0612  NA 136 138  K 3.8 3.2*  CL 97* 102  CO2 31 27  GLUCOSE 116* 97  BUN 16 12  CREATININE 0.81 0.61  CALCIUM 8.3* 7.8*   Liver Function Tests:  Recent Labs Lab 11/16/14 1015  AST 34  ALT 18  ALKPHOS 123  BILITOT 0.8  PROT 6.7  ALBUMIN 3.3*   CBC:  Recent Labs Lab 11/16/14 1015 11/17/14 0612  WBC 8.3 9.6  HGB 12.4* 11.3*  HCT 37.2* 33.4*   MCV 88.4 87.9  PLT 161 151   CBG:  Recent Labs Lab 11/16/14 1035  GLUCAP 99   Recent Results (from the past 240 hour(s))  Culture, blood (routine x 2)     Status: None (Preliminary result)   Collection Time: 11/16/14 10:23 AM  Result Value Ref Range Status   Specimen Description BLOOD RIGHT ANTECUBITAL  Final   Special Requests BOTTLES DRAWN AEROBIC ONLY 8CC  Final   Culture NO GROWTH 2 DAYS  Final   Report Status PENDING  Incomplete  Urine culture     Status: None (Preliminary result)   Collection Time: 11/16/14 12:20 PM  Result Value Ref Range Status   Specimen Description URINE, CATHETERIZED  Final   Special Requests NONE  Final   Culture   Final    >=100,000 COLONIES/mL GRAM NEGATIVE RODS CULTURE REINCUBATED FOR BETTER GROWTH Performed at [facility]    Report Status PENDING  Incomplete  Culture, blood (routine x 2)     Status: None (Preliminary result)   Collection Time: 11/16/14  1:24 PM  Result Value Ref Range Status   Specimen Description BLOOD LEFT HAND  Final  Special Requests BOTTLES DRAWN AEROBIC ONLY 4CC  Final   Culture NO GROWTH 2 DAYS  Final   Report Status PENDING  Incomplete     Scheduled Meds: . cefTRIAXone (ROCEPHIN)  IV  1 g Intravenous Q24H  . enoxaparin (LOVENOX) injection  40 mg Subcutaneous Q24H  . feeding supplement  1 Container Oral TID BM  . phenytoin (DILANTIN) IV  100 mg Intravenous Q12H  . silver sulfADIAZINE  1 application Topical Daily  . sodium chloride  3 mL Intravenous Q12H   Continuous Infusions: . sodium chloride 75 mL/hr at 11/16/14 1239  . sodium chloride 75 mL/hr at 11/17/14 [place], MD [facility] Pager [phone]. If 7 PM - 7 AM, please contact night-coverage at [email or web address], password [facility] 11/18/2014, 11:33 AM  LOS: 2 days

## 2014-11-18 NOTE — Progress Notes (Signed)
PT Cancellation Note  Patient Details Name: Jacob Wagner MRN: [id number] DOB: [birth year]   Cancelled Treatment:    Reason Eval/Treat Not Completed: Other (comment).   Pt awake and pleasant lying in the bed.  Children were present and stated that pt's wife spoke with the MD last night and they agreed that pt would not have any PT intervention due to family request.  We will d/c all of our visits.   Sable Feil 11/18/2014, 9:43 AM

## 2014-11-19 DIAGNOSIS — E43 Unspecified severe protein-calorie malnutrition: Secondary | ICD-10-CM

## 2014-11-19 DIAGNOSIS — N390 Urinary tract infection, site not specified: Secondary | ICD-10-CM | POA: Insufficient documentation

## 2014-11-19 DIAGNOSIS — B9620 Unspecified Escherichia coli [E. coli] as the cause of diseases classified elsewhere: Secondary | ICD-10-CM

## 2014-11-19 DIAGNOSIS — A419 Sepsis, unspecified organism: Secondary | ICD-10-CM | POA: Diagnosis present

## 2014-11-19 LAB — URINE CULTURE: Culture: >=100000

## 2014-11-19 MED ORDER — NITROFURANTOIN MONOHYD MACRO 100 MG PO CAPS: 100.0000 mg | ORAL_CAPSULE | Freq: Two times a day (BID) | ORAL | Status: DC

## 2014-11-19 NOTE — Progress Notes (Signed)
Urinary cath removed at 0622 with 50cc collected.  Pt tolerated well.

## 2014-11-19 NOTE — Discharge Summary (Signed)
Physician Discharge Summary  Jacob Wagner TDV:[id number] DOB: 1940/12/08 DOA: 11/16/2014  PCP: Glo Herring., MD  Admit date: 11/16/2014 Discharge date: 11/19/2014  Time spent: > 30 minutes  Recommendations for Outpatient Follow-up:  1. Follow up with Dr. Gerarda Fraction in 2-3 weeks  2. Follow up with Dr. Jannifer Franklin as scheduled  Discharge Diagnoses:  Principal Problem:   Acute encephalopathy Active Problems:   Seizure disorder   Dementia   VP (ventriculoperitoneal) shunt status   Abnormality of gait   Protein-calorie malnutrition, severe   E. coli UTI   Sepsis  Discharge Condition: stable  Diet recommendation: soft diet  Filed Weights   11/16/14 1639  Weight: 57.788 kg (127 lb 6.4 oz)   History of present illness:  Jacob Wagner is a 74 y.o. male has a past medical history significant for dementia, seizures, stroke, hypercholesterolemia. History was provided by his son and daughter. Family noticed patient has not been acting like himself progressively worsening for the past week. Reported the altered mental status has been worsening and patient is no longer eating or drinking well. Today, patient was found unresponsive by daughter and felt warm. EMS was alerted and he was brought to the ED. In ED, patient was febrile at 101F, other vital signs unremarkable. Lactic acid was elevated. U/A suggestive of UTI. Urine and blood cultures are pending. Other laboratory values are unremarkable. CXR negative for pneumonia. CT brain negative with visualization of shunt and negative for acute intracranial abnormalities. Placed on IV Rocephin for UTI. No known history of multi-drug resistant organisms. Patient is admitted for further evaluation and treatment of UTI and acute encephalopathy. Per son and daughter, patient has denied chest pain, palpitations, shortness of breath, dyspnea on exertion, cough, abdominal pain, nausea, vomiting, diarrhea, or decreased hearing. Family does endorse for the  past two days patient has had difficulty voiding; patient has the sensation to urinate, but unable to. Patient has healing pressure ulcer on left 2nd metaphalangeal. Patient has difficulty with ambulation and balance at baseline.   Hospital Course:  Acute encephalopathy in the setting of sepsis due to E coli UTI - Progressive altered mental status x 1 week most likely related to UTI, improved to baseline after antibiotics. Patient was initially treated with Ceftriaxone while waiting for urine cultures. Culture speciated E coli sensitive to Macrobid, on which he will be discharged home with 6 additional days to complete a total of 10 day course. On discharge patient was afebrile, stable and at baseline per family.  Sepsis due to UTI Severe protein calorie malnutrition - nutrition consult Seizure disorder - Stable. Continue dilantin. Dementia - Oriented to person and family, but not date.  VP shunt - Visualization of shunt on CT, correct placement.  Abnormality of gait - Baseline gait and balance issues, possibly related to h/o of stroke, PT/OT consult for evaluation of fall risk and to minimize deconditioning, family refused PT evaluation  Procedures:  None    Consultations:  None   Discharge Exam: Filed Vitals:   11/18/14 0453 11/18/14 1616 11/18/14 2314 11/19/14 0601  BP: 111/64 100/65 109/62 104/55  Pulse: 60 63 59 61  Temp: 97.8 F (36.6 C) 97.6 F (36.4 C) 98.3 F (36.8 C) 98.4 F (36.9 C)  TempSrc: Oral Oral Oral Oral  Resp: 18 16 16 16   Height:      Weight:      SpO2: 99% 98% 100% 100%   General: NAD Cardiovascular: RRR Respiratory: CTA biL  Discharge Instructions  Medication List    STOP taking these medications        sulfamethoxazole-trimethoprim 400-80 MG per tablet  Commonly known as:  BACTRIM,SEPTRA      TAKE these medications        acetaminophen 325 MG tablet  Commonly known as:  TYLENOL  Take 650 mg by mouth every 6 (six) hours as needed for  mild pain, moderate pain or headache.     diphenoxylate-atropine 2.5-0.025 MG per tablet  Commonly known as:  LOMOTIL  Take 2 tablets by mouth 4 (four) times daily as needed for diarrhea or loose stools (for 5 days).     lactulose 10 GM/15ML solution  Commonly known as:  CHRONULAC  Take 15-30 mLs by mouth daily as needed.     nitrofurantoin (macrocrystal-monohydrate) 100 MG capsule  Commonly known as:  MACROBID  Take 1 capsule (100 mg total) by mouth 2 (two) times daily.     phenytoin 100 MG ER capsule  Commonly known as:  DILANTIN  Take 1 capsule (100 mg total) by mouth 2 (two) times daily.     silver sulfADIAZINE 1 % cream  Commonly known as:  SILVADENE  Apply 1 application topically daily.           Follow-up Information    Follow up with Glo Herring., MD. Schedule an appointment as soon as possible for a visit in 2 weeks.   Specialty:  Internal Medicine   Contact information:   [address] [place] [address] [phone]      The results of significant diagnostics from this hospitalization (including imaging, microbiology, ancillary and laboratory) are listed below for reference.    Significant Diagnostic Studies: Dg Chest 1 View  11/16/2014   CLINICAL DATA:  Fever and altered mental status. History of colon and prostate carcinoma  EXAM: CHEST  1 VIEW  COMPARISON:  October 17, 2011  FINDINGS: There is persistent elevation of the left hemidiaphragm. There is a calcified granuloma in the right base. There is atelectatic change in the left base. The lungs elsewhere clear. Heart is upper normal in size with pulmonary vascularity within normal limits. No adenopathy. A shunt extends along the anterior right hemithorax.  IMPRESSION: Atelectasis left base. Stable elevation left hemidiaphragm. Calcified granuloma right base. No edema or consolidation. No change in cardiac silhouette.   Electronically Signed   By: Lowella Grip III M.D.   On: 11/16/2014 13:16   Ct  Head Wo Contrast  11/16/2014   CLINICAL DATA:  Altered mental status and fever. History of seizures. Found unresponsive.  EXAM: CT HEAD WITHOUT CONTRAST  TECHNIQUE: Contiguous axial images were obtained from the base of the skull through the vertex without intravenous contrast.  COMPARISON:  Sep 26, 2014  FINDINGS: There is moderate atrophy which is stable. The ventricular shunt catheter tip passes through the lateral ventricles and resides to the left of the third ventricle slightly superior to the left suprasellar cistern. This is a stable finding. There is no intracranial mass, hemorrhage, extra-axial fluid collection, or midline shift. There is evidence of prior infarcts in the right frontal and left temporal lobes, stable. There is evidence of prior infarction throughout the rostrum of the corpus callosum, stable. There is small vessel disease throughout the centra semiovale bilaterally. There is no new gray-white compartment lesion. There is no acute infarct evident. There is no intracranial mass, hemorrhage, or extra-axial fluid collection. There is a shunt catheter defect in the right frontal bone. Bony structures otherwise appear  intact. The mastoid air cells are clear.  IMPRESSION: Shunt catheter with the tip just superior to the left suprasellar cisterna lateral to the third ventricle. Prior infarcts and supratentorial small vessel disease, stable. Underlying atrophy, stable. No mass, hemorrhage, or evidence suggesting acute infarct.   Electronically Signed   By: Lowella Grip III M.D.   On: 11/16/2014 13:30    Microbiology: Recent Results (from the past 240 hour(s))  Culture, blood (routine x 2)     Status: None (Preliminary result)   Collection Time: 11/16/14 10:23 AM  Result Value Ref Range Status   Specimen Description BLOOD RIGHT ANTECUBITAL  Final   Special Requests BOTTLES DRAWN AEROBIC ONLY 8CC  Final   Culture NO GROWTH 2 DAYS  Final   Report Status PENDING  Incomplete  Urine  culture     Status: None   Collection Time: 11/16/14 12:20 PM  Result Value Ref Range Status   Specimen Description URINE, CATHETERIZED  Final   Special Requests NONE  Final   Culture   Final    >=100,000 COLONIES/mL ESCHERICHIA COLI Performed at [facility]    Report Status 11/19/2014 FINAL  Final   Organism ID, Bacteria ESCHERICHIA COLI  Final      Susceptibility   Escherichia coli - MIC*    AMPICILLIN >=32 RESISTANT Resistant     CEFAZOLIN <=4 SENSITIVE Sensitive     CEFTRIAXONE <=1 SENSITIVE Sensitive     CIPROFLOXACIN <=0.25 SENSITIVE Sensitive     GENTAMICIN <=1 SENSITIVE Sensitive     IMIPENEM <=0.25 SENSITIVE Sensitive     NITROFURANTOIN <=16 SENSITIVE Sensitive     TRIMETH/SULFA <=20 SENSITIVE Sensitive     AMPICILLIN/SULBACTAM 16 INTERMEDIATE Intermediate     PIP/TAZO <=4 SENSITIVE Sensitive     * >=100,000 COLONIES/mL ESCHERICHIA COLI  Culture, blood (routine x 2)     Status: None (Preliminary result)   Collection Time: 11/16/14  1:24 PM  Result Value Ref Range Status   Specimen Description BLOOD LEFT HAND  Final   Special Requests BOTTLES DRAWN AEROBIC ONLY 4CC  Final   Culture NO GROWTH 2 DAYS  Final   Report Status PENDING  Incomplete     Labs: Basic Metabolic Panel:  Recent Labs Lab 11/16/14 1015 11/17/14 0612  NA 136 138  K 3.8 3.2*  CL 97* 102  CO2 31 27  GLUCOSE 116* 97  BUN 16 12  CREATININE 0.81 0.61  CALCIUM 8.3* 7.8*   Liver Function Tests:  Recent Labs Lab 11/16/14 1015  AST 34  ALT 18  ALKPHOS 123  BILITOT 0.8  PROT 6.7  ALBUMIN 3.3*   CBC:  Recent Labs Lab 11/16/14 1015 11/17/14 0612  WBC 8.3 9.6  HGB 12.4* 11.3*  HCT 37.2* 33.4*  MCV 88.4 87.9  PLT 161 151   CBG:  Recent Labs Lab 11/16/14 1035  GLUCAP 99    Signed:  GHERGHE, COSTIN  [facility] 11/19/2014, 4:17 PM

## 2014-11-19 NOTE — Progress Notes (Signed)
22 MD gave orders to give patient currently scheduled IV Rocephin (1600 dose 11/19/14) then d/c home.

## 2014-11-19 NOTE — Discharge Instructions (Signed)
Follow with Jacob Wagner., MD in 2-3 weeks  Please get a complete blood count and chemistry panel checked by your Primary MD at your next visit, and again as instructed by your Primary MD. Please get your medications reviewed and adjusted by your Primary MD.  Please request your Primary MD to go over all Hospital Tests and Procedure/Radiological results at the follow up, please get all Hospital records sent to your Prim MD by signing hospital release before you go home.  If you had Pneumonia of Lung problems at the Hospital: Please get a 2 view Chest X ray done in 6-8 weeks after hospital discharge or sooner if instructed by your Primary MD.  If you have Congestive Heart Failure: Please call your Cardiologist or Primary MD anytime you have any of the following symptoms:  1) 3 pound weight gain in 24 hours or 5 pounds in 1 week  2) shortness of breath, with or without a dry hacking cough  3) swelling in the hands, feet or stomach  4) if you have to sleep on extra pillows at night in order to breathe  Follow cardiac low salt diet and 1.5 lit/day fluid restriction.  If you have diabetes Accuchecks 4 times/day, Once in AM empty stomach and then before each meal. Log in all results and show them to your primary doctor at your next visit. If any glucose reading is under 80 or above 300 call your primary MD immediately.  If you have Seizure/Convulsions/Epilepsy: Please do not drive, operate heavy machinery, participate in activities at heights or participate in high speed sports until you have seen by Primary MD or a Neurologist and advised to do so again.  If you had Gastrointestinal Bleeding: Please ask your Primary MD to check a complete blood count within one week of discharge or at your next visit. Your endoscopic/colonoscopic biopsies that are pending at the time of discharge, will also need to followed by your Primary MD.  Get Medicines reviewed and adjusted. Please take all your  medications with you for your next visit with your Primary MD  Please request your Primary MD to go over all hospital tests and procedure/radiological results at the follow up, please ask your Primary MD to get all Hospital records sent to his/her office.  If you experience worsening of your admission symptoms, develop shortness of breath, life threatening emergency, suicidal or homicidal thoughts you must seek medical attention immediately by calling 911 or calling your MD immediately  if symptoms less severe.  You must read complete instructions/literature along with all the possible adverse reactions/side effects for all the Medicines you take and that have been prescribed to you. Take any new Medicines after you have completely understood and accpet all the possible adverse reactions/side effects.   Do not drive or operate heavy machinery when taking Pain medications.   Do not take more than prescribed Pain, Sleep and Anxiety Medications  Special Instructions: If you have smoked or chewed Tobacco  in the last 2 yrs please stop smoking, stop any regular Alcohol  and or any Recreational drug use.  Wear Seat belts while driving.  Please note You were cared for by a hospitalist during your hospital stay. If you have any questions about your discharge medications or the care you received while you were in the hospital after you are discharged, you can call the unit and asked to speak with the hospitalist on call if the hospitalist that took care of you is not available. Once  you are discharged, your primary care physician will handle any further medical issues. Please note that NO REFILLS for any discharge medications will be authorized once you are discharged, as it is imperative that you return to your primary care physician (or establish a relationship with a primary care physician if you do not have one) for your aftercare needs so that they can reassess your need for medications and monitor your  lab values.  You can reach the hospitalist office at phone [phone] or fax [phone]   If you do not have a primary care physician, you can call [phone] for a physician referral.  Activity: As tolerated with Full fall precautions use walker/cane & assistance as needed  Diet: regular  Disposition Home

## 2014-11-21 LAB — CULTURE, BLOOD (ROUTINE X 2)
CULTURE: NO GROWTH
Culture: NO GROWTH

## 2014-12-05 DIAGNOSIS — Z681 Body mass index (BMI) 19 or less, adult: Secondary | ICD-10-CM | POA: Diagnosis not present

## 2014-12-05 DIAGNOSIS — N390 Urinary tract infection, site not specified: Secondary | ICD-10-CM | POA: Diagnosis not present

## 2014-12-05 DIAGNOSIS — R6520 Severe sepsis without septic shock: Secondary | ICD-10-CM | POA: Diagnosis not present

## 2014-12-05 DIAGNOSIS — R410 Disorientation, unspecified: Secondary | ICD-10-CM | POA: Diagnosis not present

## 2014-12-05 DIAGNOSIS — Z1389 Encounter for screening for other disorder: Secondary | ICD-10-CM | POA: Diagnosis not present

## 2014-12-05 DIAGNOSIS — F0390 Unspecified dementia without behavioral disturbance: Secondary | ICD-10-CM | POA: Diagnosis not present

## 2014-12-09 DIAGNOSIS — N390 Urinary tract infection, site not specified: Secondary | ICD-10-CM | POA: Diagnosis not present

## 2015-01-05 DIAGNOSIS — N390 Urinary tract infection, site not specified: Secondary | ICD-10-CM | POA: Diagnosis not present

## 2015-01-13 ENCOUNTER — Ambulatory Visit (INDEPENDENT_AMBULATORY_CARE_PROVIDER_SITE_OTHER): Payer: Medicare Other | Admitting: Urology

## 2015-01-13 DIAGNOSIS — N3941 Urge incontinence: Secondary | ICD-10-CM

## 2015-01-13 DIAGNOSIS — C61 Malignant neoplasm of prostate: Secondary | ICD-10-CM | POA: Diagnosis not present

## 2015-02-02 ENCOUNTER — Ambulatory Visit: Payer: Medicare Other | Admitting: Neurology

## 2015-03-03 DIAGNOSIS — N390 Urinary tract infection, site not specified: Secondary | ICD-10-CM | POA: Diagnosis not present

## 2015-04-05 DIAGNOSIS — C61 Malignant neoplasm of prostate: Secondary | ICD-10-CM | POA: Diagnosis not present

## 2015-04-14 ENCOUNTER — Ambulatory Visit: Payer: Medicare Other | Admitting: Urology

## 2015-05-05 DIAGNOSIS — Z1389 Encounter for screening for other disorder: Secondary | ICD-10-CM | POA: Diagnosis not present

## 2015-05-05 DIAGNOSIS — N400 Enlarged prostate without lower urinary tract symptoms: Secondary | ICD-10-CM | POA: Diagnosis not present

## 2015-05-05 DIAGNOSIS — R3129 Other microscopic hematuria: Secondary | ICD-10-CM | POA: Diagnosis not present

## 2015-05-05 DIAGNOSIS — F0390 Unspecified dementia without behavioral disturbance: Secondary | ICD-10-CM | POA: Diagnosis not present

## 2015-05-05 DIAGNOSIS — Z6821 Body mass index (BMI) 21.0-21.9, adult: Secondary | ICD-10-CM | POA: Diagnosis not present

## 2015-05-05 DIAGNOSIS — C189 Malignant neoplasm of colon, unspecified: Secondary | ICD-10-CM | POA: Diagnosis not present

## 2015-05-05 DIAGNOSIS — K529 Noninfective gastroenteritis and colitis, unspecified: Secondary | ICD-10-CM | POA: Diagnosis not present

## 2015-05-05 DIAGNOSIS — Z681 Body mass index (BMI) 19 or less, adult: Secondary | ICD-10-CM | POA: Diagnosis not present

## 2015-05-05 DIAGNOSIS — K219 Gastro-esophageal reflux disease without esophagitis: Secondary | ICD-10-CM | POA: Diagnosis not present

## 2015-05-05 DIAGNOSIS — C61 Malignant neoplasm of prostate: Secondary | ICD-10-CM | POA: Diagnosis not present

## 2015-05-05 DIAGNOSIS — G40309 Generalized idiopathic epilepsy and epileptic syndromes, not intractable, without status epilepticus: Secondary | ICD-10-CM | POA: Diagnosis not present

## 2015-05-05 DIAGNOSIS — J449 Chronic obstructive pulmonary disease, unspecified: Secondary | ICD-10-CM | POA: Diagnosis not present

## 2015-05-15 ENCOUNTER — Telehealth (INDEPENDENT_AMBULATORY_CARE_PROVIDER_SITE_OTHER): Payer: Self-pay | Admitting: *Deleted

## 2015-05-15 NOTE — Telephone Encounter (Signed)
Please call patient's wife [name], she has questions about his CEA you done back in 04/2012 -- ph# [phone]

## 2015-05-15 NOTE — Telephone Encounter (Signed)
Talked with wife and results given which were normal.

## 2015-05-19 ENCOUNTER — Ambulatory Visit (INDEPENDENT_AMBULATORY_CARE_PROVIDER_SITE_OTHER): Payer: Medicare Other | Admitting: Urology

## 2015-05-19 DIAGNOSIS — C61 Malignant neoplasm of prostate: Secondary | ICD-10-CM | POA: Diagnosis not present

## 2015-05-19 DIAGNOSIS — N3941 Urge incontinence: Secondary | ICD-10-CM

## 2015-05-24 ENCOUNTER — Encounter (INDEPENDENT_AMBULATORY_CARE_PROVIDER_SITE_OTHER): Payer: Self-pay | Admitting: *Deleted

## 2015-06-16 ENCOUNTER — Ambulatory Visit (INDEPENDENT_AMBULATORY_CARE_PROVIDER_SITE_OTHER): Payer: Medicare Other | Admitting: Internal Medicine

## 2015-06-27 ENCOUNTER — Encounter (INDEPENDENT_AMBULATORY_CARE_PROVIDER_SITE_OTHER): Payer: Self-pay | Admitting: Internal Medicine

## 2015-06-27 ENCOUNTER — Ambulatory Visit (INDEPENDENT_AMBULATORY_CARE_PROVIDER_SITE_OTHER): Payer: Medicare Other | Admitting: Internal Medicine

## 2015-06-27 VITALS — BP 90/62 | HR 60 | Temp 97.5°F | Resp 18 | Ht 72.0 in | Wt 148.9 lb

## 2015-06-27 DIAGNOSIS — K5900 Constipation, unspecified: Secondary | ICD-10-CM

## 2015-06-27 DIAGNOSIS — Z85038 Personal history of other malignant neoplasm of large intestine: Secondary | ICD-10-CM | POA: Diagnosis not present

## 2015-06-27 DIAGNOSIS — K5909 Other constipation: Secondary | ICD-10-CM

## 2015-06-27 MED ORDER — BENEFIBER DRINK MIX PO PACK: 4.0000 g | PACK | Freq: Every day | ORAL | Status: DC

## 2015-06-27 NOTE — Progress Notes (Signed)
Presenting complaint;  Constipation. History of colon carcinoma.  History of present illness:  Patient is 75 year old African-American male who is referred through courtesy of Dr. Gerarda Fraction for GI evaluation. He is accompanied by his daughter [name] who provided history. Patient's daughter [name] states that about 2 years ago he developed diarrhea and was begun on lactose-free diet which helped. Bowel frequency decreased. But stools remain loose. He was having accidents. He did not experience melena or rectal bleeding. He was begun on diphenoxylate which she has taken on as-needed basis. However lately he's had more problems with constipation. He has gone as many as 4 days without a bowel movement. He takes lactulose resulting in bowel movement and he may have diarrhea with incontinence. Family is trying to to make him eat fiber rich foods and he is eating whole wheat bread. He has good appetite and he has not lost any weight. He has history of colon carcinoma. He had left hemicolectomy in October 2004. Last colonoscopy with polypectomy was in May 2007 with removal of small polyp from sigmoid colon with removal of 3 polyps and 2 were quite dilated. These polyps are hyperplastic. He was scheduled for colonoscopy few years ago but was not performed because he could not take the prep. Patient denies nausea vomiting abdominal pain. Does walk in and outside the house. His daughter states his dementia is getting worse.  Current Medications: Outpatient Encounter Prescriptions as of 06/27/2015  Medication Sig  . acetaminophen (TYLENOL) 325 MG tablet Take 650 mg by mouth every 6 (six) hours as needed for mild pain, moderate pain or headache.   . lactulose (CHRONULAC) 10 GM/15ML solution Take 15-30 mLs by mouth daily as needed.  . Melatonin 2.5 MG CAPS Take by mouth at bedtime.  . phenytoin (DILANTIN) 100 MG ER capsule Take 1 capsule (100 mg total) by mouth 2 (two) times daily.  . diphenoxylate-atropine (LOMOTIL)  2.5-0.025 MG per tablet Take 2 tablets by mouth 4 (four) times daily as needed for diarrhea or loose stools (for 5 days). Reported on 06/27/2015  . [DISCONTINUED] nitrofurantoin, macrocrystal-monohydrate, (MACROBID) 100 MG capsule Take 1 capsule (100 mg total) by mouth 2 (two) times daily. (Patient not taking: Reported on 06/27/2015)  . [DISCONTINUED] silver sulfADIAZINE (SILVADENE) 1 % cream Apply 1 application topically daily. Reported on 06/27/2015   No facility-administered encounter medications on file as of 06/27/2015.   Past medical history: Past Medical History  Diagnosis Date  . Seizures ([place])   . Dementia   . 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 Insomnia   . Dementia   . Hypercholesteremia   . Colon cancer ([place])   . Prostate cancer ([place])   . Stroke [facility])     left temporal  . Abnormality of gait 09/20/2014  . Memory difficulties 09/20/2014   Past Surgical History  Procedure Laterality Date  . Prostate cancer    . Colon surgery 10 yrs ago    . Shunt to head    . Foot surgery      left ankle ORIF/ fracture  . Inguinal hernia repair Right      Physical examination:  Blood pressure 90/62, pulse 60, temperature 97.5 F (36.4 C), temperature source Oral, resp. rate 18, height 6' (1.829 m), weight 148 lb 14.4 oz (67.541 kg). Patient is alert and in no acute distress. Patient is able to move from chair to examination table with minimal assistance. Conjunctiva is pink. Sclera is nonicteric Oropharyngeal mucosa is normal. No neck  masses or thyromegaly noted. Cardiac exam with regular rhythm normal S1 and S2. No murmur or gallop noted. Lungs are clear to auscultation. Abdomen is symmetrical soft and nontender without organomegaly or masses.  No LE edema or clubbing noted.  Labs/studies Results:  Lab data from Dr. Nolon Rod office(05/05/2015). WBC 6.0, H&H 13.1 and 38.0 and platelet count 245K BUN 11 and creatinine 0.72 Bilirubin 0.3, AP 166, AST 27, ALT 24, albumin 3.9 and  calcium 8.7  CEA 2.4  PSA 4.4.    abdominopelvic CT from 08/01/2011 reviewed and revealed significant colonic stool burden.  Assessment:  #1. Chronic constipation. Etiology appears to be limited physical activity. He needs to be on a high fiber diet plus fiber supplement and should use lactulose on a regular basis rather than when necessary. #2. History of colorectal carcinoma. Status post right hemicolectomy in October 2004. He is long overdue for surveillance colonoscopy. #3. Mildly elevated alkaline phosphatase with normal transaminases. Alkaline phosphatase was 219 on 09/20/2014 and 123 on 11/16/2014. It is possibly secondary to one of his medications and may need to be followed. There is no evidence of bone metastases due to prostate CA.   Recommendations:  High fiber diet. Benefiber 4 g by mouth daily at bedtime. Lactulose 10-20 g by mouth daily at bedtime. Use Dulcolax suppository or Fleet's enema one as-needed basis. Will schedule surveillance colonoscopy after talking with his wife who has a prior for turning.

## 2015-06-27 NOTE — Patient Instructions (Addendum)
High fiber diet.  Use lactulose every night; can titrate dose. Can you suppository or Fleet enema if patient has no bowel movement for 2 days. Will discuss need for colonoscopy with Mrs. Mauceri who has power of attorney.

## 2015-07-04 ENCOUNTER — Telehealth (INDEPENDENT_AMBULATORY_CARE_PROVIDER_SITE_OTHER): Payer: Self-pay | Admitting: Internal Medicine

## 2015-07-04 NOTE — Telephone Encounter (Signed)
Mrs. Bruegger called saying she needs to know what type of infection her husband had for two years. In addition she wants to discuss if he needs a colonoscopy. Please give her a phone call regarding this.  Pt's ph# [phone] Thank you.

## 2015-07-05 NOTE — Telephone Encounter (Signed)
I shared the info noted below with his wife. She says that her husband has been having 1- 2 good BMs daily since his OV here. They have followed the instructions given at that time. Her concerns are his dementia. He has a brain tumor and a shunt in his head. He experiences a lot of seizures. She says he has Prostate Cancer and felt that went along with him having Colon Cancer along time ago.  Wife is concerned about him having to be put under.  To discuss with Dr.Rehman.

## 2015-07-05 NOTE — Telephone Encounter (Signed)
Patient seen in our office last week this is the assessement and recommendation per Dr.Rehman.  Assessment:  #1. Chronic constipation. Etiology appears to be limited physical activity. He needs to be on a high fiber diet plus fiber supplement and should use lactulose on a regular basis rather than when necessary. #2. History of colorectal carcinoma. Status post right hemicolectomy in October 2004. He is long overdue for surveillance colonoscopy. #3. Mildly elevated alkaline phosphatase with normal transaminases. Alkaline phosphatase was 219 on 09/20/2014 and 123 on 11/16/2014. It is possibly secondary to one of his medications and may need to be followed. There is no evidence of bone metastases due to prostate CA.   Recommendations:  High fiber diet. Benefiber 4 g by mouth daily at bedtime. Lactulose 10-20 g by mouth daily at bedtime. Use Dulcolax suppository or Fleet's enema one as-needed basis. Will schedule surveillance colonoscopy after talking with his wife who has a prior for turning.

## 2015-07-06 NOTE — Telephone Encounter (Signed)
Per Dr.Rehman after hearing the wife's concerns. We will hold off at this time for the Colonoscopy. Patient is having good BM's per since his office visit and following Dr.Rehman's recommendations. Patient's wife was called and made aware.

## 2015-07-09 ENCOUNTER — Telehealth (INDEPENDENT_AMBULATORY_CARE_PROVIDER_SITE_OTHER): Payer: Self-pay | Admitting: Internal Medicine

## 2015-07-09 NOTE — Telephone Encounter (Signed)
I talked with patient's wife [name]. He is not sure if she wants her husband have colonoscopy. She will let us know if she decides to proceed forward.

## 2015-07-13 ENCOUNTER — Encounter (INDEPENDENT_AMBULATORY_CARE_PROVIDER_SITE_OTHER): Payer: Self-pay

## 2015-07-17 DIAGNOSIS — Z1389 Encounter for screening for other disorder: Secondary | ICD-10-CM | POA: Diagnosis not present

## 2015-07-17 DIAGNOSIS — J209 Acute bronchitis, unspecified: Secondary | ICD-10-CM | POA: Diagnosis not present

## 2015-07-17 DIAGNOSIS — G40309 Generalized idiopathic epilepsy and epileptic syndromes, not intractable, without status epilepticus: Secondary | ICD-10-CM | POA: Diagnosis not present

## 2015-07-17 DIAGNOSIS — Z6821 Body mass index (BMI) 21.0-21.9, adult: Secondary | ICD-10-CM | POA: Diagnosis not present

## 2015-07-17 DIAGNOSIS — J019 Acute sinusitis, unspecified: Secondary | ICD-10-CM | POA: Diagnosis not present

## 2015-07-17 DIAGNOSIS — F0390 Unspecified dementia without behavioral disturbance: Secondary | ICD-10-CM | POA: Diagnosis not present

## 2015-07-17 DIAGNOSIS — J9801 Acute bronchospasm: Secondary | ICD-10-CM | POA: Diagnosis not present

## 2015-08-15 ENCOUNTER — Telehealth (INDEPENDENT_AMBULATORY_CARE_PROVIDER_SITE_OTHER): Payer: Self-pay | Admitting: *Deleted

## 2015-08-15 NOTE — Telephone Encounter (Signed)
Jacob Wagner called and said he had some kind of infection and possibly had for last 2 years.  She had no idea what it was.  I looked and I could not see it.  Appt given for diarrhea/constipation for Monday with Terri ad she will ask daughter if they can come then.  Meantime can you call her and see what the infection was.

## 2015-08-15 NOTE — Telephone Encounter (Signed)
Patient was to have a Surveillance Colonoscopy, this was discussed at the time of his OV with Dr.Rehman with his 2 children. They stated that they would need to talk with their Mother and sister. I talked with his wife a few days afterwards. She said that his bowels were good ,refer to telephone encounter. She said that he had to many health problems and they were concerned about him having a Colonoscopy. Patient has hx of CA. The surveillance was to make sure there was no problems.

## 2015-08-21 ENCOUNTER — Ambulatory Visit (INDEPENDENT_AMBULATORY_CARE_PROVIDER_SITE_OTHER): Payer: Medicare Other | Admitting: Internal Medicine

## 2015-09-25 DIAGNOSIS — K529 Noninfective gastroenteritis and colitis, unspecified: Secondary | ICD-10-CM | POA: Diagnosis not present

## 2015-09-25 DIAGNOSIS — Z1389 Encounter for screening for other disorder: Secondary | ICD-10-CM | POA: Diagnosis not present

## 2015-09-25 DIAGNOSIS — J449 Chronic obstructive pulmonary disease, unspecified: Secondary | ICD-10-CM | POA: Diagnosis not present

## 2015-09-25 DIAGNOSIS — Z6820 Body mass index (BMI) 20.0-20.9, adult: Secondary | ICD-10-CM | POA: Diagnosis not present

## 2015-10-03 ENCOUNTER — Encounter (INDEPENDENT_AMBULATORY_CARE_PROVIDER_SITE_OTHER): Payer: Self-pay | Admitting: Internal Medicine

## 2015-10-03 ENCOUNTER — Ambulatory Visit (INDEPENDENT_AMBULATORY_CARE_PROVIDER_SITE_OTHER): Payer: Medicare Other | Admitting: Internal Medicine

## 2015-10-03 VITALS — BP 110/80 | HR 66 | Temp 98.2°F | Resp 18 | Ht 72.0 in | Wt 151.0 lb

## 2015-10-03 DIAGNOSIS — R197 Diarrhea, unspecified: Secondary | ICD-10-CM | POA: Diagnosis not present

## 2015-10-03 DIAGNOSIS — R634 Abnormal weight loss: Secondary | ICD-10-CM | POA: Diagnosis not present

## 2015-10-03 DIAGNOSIS — Z85038 Personal history of other malignant neoplasm of large intestine: Secondary | ICD-10-CM

## 2015-10-03 LAB — TSH: TSH: 4.45 mIU/L (ref 0.40–4.50)

## 2015-10-03 NOTE — Patient Instructions (Signed)
Please make sure that he has at least two bowel movements each week. Patient will call with results of blood tests when completed.

## 2015-10-03 NOTE — Progress Notes (Signed)
Presenting complaint;  Follow-up for bowel problems. History of colon carcinoma.  Subjective:  Patient is 75 year old African-American male who is here for scheduled visit accompanied by his daughter and wife. He was last seen on 06/27/2015 when he was complaining of constipation. His advice to take Benefiber lactulose daily.  History is provided by his wife and water. They stated that he has diarrhea and has to take Lomotil every day. He is taking it 1 tablet twice daily. While on this medication he has one to 2 bowel movements per week. His stools are formed. If he does not take antidiarrheal he has multiple stools per day. He is not taking lactulose. He has very good appetite. No melena or rectal bleeding reported. Patient denies abdominal pain. He has gained 3 pounds since his last visit. Daughter is concerned that he may have celiac disease given massive weight loss over the last few years. His wife feels that he would not be able to tolerate prep for colonoscopy.   Current Medications: Outpatient Encounter Prescriptions as of 10/03/2015  Medication Sig  . acetaminophen (TYLENOL) 325 MG tablet Take 650 mg by mouth every 6 (six) hours as needed for mild pain, moderate pain or headache.   . diphenoxylate-atropine (LOMOTIL) 2.5-0.025 MG per tablet Take 2 tablets by mouth 4 (four) times daily as needed for diarrhea or loose stools (for 5 days). Reported on 06/27/2015  . lactulose (CHRONULAC) 10 GM/15ML solution Take 15-30 mLs by mouth at bedtime.  . phenytoin (DILANTIN) 100 MG ER capsule Take 1 capsule (100 mg total) by mouth 2 (two) times daily.  . Wheat Dextrin (BENEFIBER DRINK MIX) PACK Take 4 g by mouth at bedtime. (Patient not taking: Reported on 10/03/2015)  . [DISCONTINUED] Melatonin 2.5 MG CAPS Take by mouth at bedtime. Reported on 10/03/2015   No facility-administered encounter medications on file as of 10/03/2015.     Objective: Blood pressure 110/80, pulse 66, temperature 98.2 F  (36.8 C), temperature source Oral, resp. rate 18, height 6' (1.829 m), weight 151 lb (68.493 kg). Patient is alert but quiet. He is able to move from chair to examination table with minimal support.  Conjunctiva is pink. Sclera is nonicteric Oropharyngeal mucosa is normal. No neck masses or thyromegaly noted. Cardiac exam with regular rhythm normal S1 and S2. No murmur or gallop noted. Lungs are clear to auscultation. Abdomen is flat soft and nontender without organomegaly or masses.  No LE edema or clubbing noted.    Assessment:  #1. Irregular bowel movements. He used to have constipation but now he has diarrhea unless he takes diphenoxylate. Patient needs to have at least 2 bowel movements per week so that he would not end up with fecal impaction. Will check for celiac disease. #2. History of colon carcinoma. He is long overdue for surveillance colonoscopy. Last exam was in 2007. His wife feels he would not be able to take prep colonoscopy. #3. Weight loss. Although he has gained 3 pounds since his last visit of [place] 2017 his weight is down by 53 pounds since December 2013. Weight loss may have to do with this dementia and diminished intake. Need to rule out thyroid disease.   Plan:  Continue diphenoxylate at 1 tablet by mouth twice a day. I encouraged patient's wife and their daughter to give him at least 2 doses of lactulose per week so that he would not become impacted. Will check TSH and celiac antibody panel.

## 2015-10-04 LAB — TISSUE TRANSGLUTAMINASE, IGA: Tissue Transglutaminase Ab, IgA: 1 U/mL (ref <4)

## 2015-10-04 LAB — GLIADIN ANTIBODIES, SERUM
GLIADIN IGG: 2 U (ref <20)
Gliadin IgA: 4 Units (ref <20)

## 2015-10-05 LAB — RETICULIN ANTIBODIES, IGA W TITER: Reticulin Ab, IgA: NEGATIVE

## 2015-10-20 ENCOUNTER — Telehealth (INDEPENDENT_AMBULATORY_CARE_PROVIDER_SITE_OTHER): Payer: Self-pay | Admitting: Internal Medicine

## 2015-10-20 DIAGNOSIS — Z6820 Body mass index (BMI) 20.0-20.9, adult: Secondary | ICD-10-CM | POA: Diagnosis not present

## 2015-10-20 DIAGNOSIS — J449 Chronic obstructive pulmonary disease, unspecified: Secondary | ICD-10-CM | POA: Diagnosis not present

## 2015-10-20 DIAGNOSIS — J018 Other acute sinusitis: Secondary | ICD-10-CM | POA: Diagnosis not present

## 2015-10-20 DIAGNOSIS — R109 Unspecified abdominal pain: Secondary | ICD-10-CM | POA: Diagnosis not present

## 2015-10-20 DIAGNOSIS — J209 Acute bronchitis, unspecified: Secondary | ICD-10-CM | POA: Diagnosis not present

## 2015-10-20 NOTE — Telephone Encounter (Signed)
Patient's wife, Cleda Mccreedy called, stated that the patient has [employer] (not sure that is correct, but that's what she said).  She wanting to know if there is something he can take for it.  [phone]

## 2015-10-20 NOTE — Telephone Encounter (Signed)
Talked with the patient's wife.She would like to know if there is anything over the counter for IBS that her husband may take. She was advised that we would talk with Dr.Rehman and it may be next week before we get back to her with his answer.

## 2015-10-26 NOTE — Telephone Encounter (Signed)
Per wife ,patient is better for now.

## 2015-11-13 ENCOUNTER — Telehealth (INDEPENDENT_AMBULATORY_CARE_PROVIDER_SITE_OTHER): Payer: Self-pay | Admitting: Internal Medicine

## 2015-11-13 NOTE — Telephone Encounter (Signed)
Patient's daughter called, stated the patient and his wife have questions about his diagnosis.  [phone]

## 2015-11-22 DIAGNOSIS — C61 Malignant neoplasm of prostate: Secondary | ICD-10-CM | POA: Diagnosis not present

## 2015-11-22 DIAGNOSIS — N390 Urinary tract infection, site not specified: Secondary | ICD-10-CM | POA: Diagnosis not present

## 2015-11-22 NOTE — Telephone Encounter (Signed)
Hope this is for Dr. Rehman 

## 2015-11-22 NOTE — Telephone Encounter (Signed)
Per Karna Christmas, this is for Dr. Laural Golden.

## 2015-11-24 NOTE — Telephone Encounter (Signed)
Per Dr.Rehman - Patient should talk with his PCP about the diagnosis. Called patient's wife,Linda, and told her.

## 2015-11-27 DIAGNOSIS — F0280 Dementia in other diseases classified elsewhere without behavioral disturbance: Secondary | ICD-10-CM | POA: Diagnosis not present

## 2015-11-27 DIAGNOSIS — J449 Chronic obstructive pulmonary disease, unspecified: Secondary | ICD-10-CM | POA: Diagnosis not present

## 2015-11-27 DIAGNOSIS — J209 Acute bronchitis, unspecified: Secondary | ICD-10-CM | POA: Diagnosis not present

## 2015-11-27 DIAGNOSIS — Z1389 Encounter for screening for other disorder: Secondary | ICD-10-CM | POA: Diagnosis not present

## 2015-11-27 DIAGNOSIS — K219 Gastro-esophageal reflux disease without esophagitis: Secondary | ICD-10-CM | POA: Diagnosis not present

## 2015-11-27 DIAGNOSIS — Z6820 Body mass index (BMI) 20.0-20.9, adult: Secondary | ICD-10-CM | POA: Diagnosis not present

## 2015-12-05 ENCOUNTER — Telehealth (INDEPENDENT_AMBULATORY_CARE_PROVIDER_SITE_OTHER): Payer: Self-pay | Admitting: Internal Medicine

## 2015-12-05 NOTE — Telephone Encounter (Signed)
Patient's daughter called and stated that the patient has IBS and needs a list of foods that he needs to avoid.  They would like this mailed to them.   [phone]

## 2015-12-06 ENCOUNTER — Other Ambulatory Visit: Payer: Self-pay | Admitting: Urology

## 2015-12-06 DIAGNOSIS — C61 Malignant neoplasm of prostate: Secondary | ICD-10-CM

## 2015-12-06 NOTE — Telephone Encounter (Signed)
I am sending, (mailing) out diet for the patient.

## 2015-12-07 ENCOUNTER — Encounter (HOSPITAL_COMMUNITY): Payer: Medicare Other

## 2015-12-08 DIAGNOSIS — C61 Malignant neoplasm of prostate: Secondary | ICD-10-CM | POA: Diagnosis not present

## 2015-12-11 DIAGNOSIS — N390 Urinary tract infection, site not specified: Secondary | ICD-10-CM | POA: Diagnosis not present

## 2015-12-11 DIAGNOSIS — Z6820 Body mass index (BMI) 20.0-20.9, adult: Secondary | ICD-10-CM | POA: Diagnosis not present

## 2015-12-11 DIAGNOSIS — Z1389 Encounter for screening for other disorder: Secondary | ICD-10-CM | POA: Diagnosis not present

## 2015-12-11 DIAGNOSIS — N410 Acute prostatitis: Secondary | ICD-10-CM | POA: Diagnosis not present

## 2015-12-14 ENCOUNTER — Encounter (HOSPITAL_COMMUNITY): Payer: Medicare Other

## 2015-12-15 ENCOUNTER — Ambulatory Visit (HOSPITAL_COMMUNITY): Payer: Medicare Other

## 2016-01-01 DIAGNOSIS — C61 Malignant neoplasm of prostate: Secondary | ICD-10-CM | POA: Diagnosis not present

## 2016-01-05 ENCOUNTER — Other Ambulatory Visit (HOSPITAL_COMMUNITY)
Admission: RE | Admit: 2016-01-05 | Discharge: 2016-01-05 | Disposition: A | Discharge status: Home / Self Care | Payer: Medicare Other | Source: Other Acute Inpatient Hospital | Attending: Urology | Admitting: Urology

## 2016-01-05 ENCOUNTER — Ambulatory Visit (INDEPENDENT_AMBULATORY_CARE_PROVIDER_SITE_OTHER): Payer: Medicare Other | Admitting: Urology

## 2016-01-05 DIAGNOSIS — N3941 Urge incontinence: Secondary | ICD-10-CM | POA: Diagnosis not present

## 2016-01-05 DIAGNOSIS — R3121 Asymptomatic microscopic hematuria: Secondary | ICD-10-CM | POA: Insufficient documentation

## 2016-01-05 DIAGNOSIS — C61 Malignant neoplasm of prostate: Secondary | ICD-10-CM | POA: Diagnosis not present

## 2016-01-05 LAB — URINALYSIS W MICROSCOPIC (NOT AT ARMC)
BILIRUBIN URINE: NEGATIVE
Bacteria, UA: NONE SEEN
GLUCOSE, UA: NEGATIVE mg/dL
KETONES UR: NEGATIVE mg/dL
LEUKOCYTES UA: NEGATIVE
NITRITE: NEGATIVE
PROTEIN: NEGATIVE mg/dL
SQUAMOUS EPITHELIAL / LPF: NONE SEEN
Specific Gravity, Urine: <1.005 — ABNORMAL LOW (ref 1.005–1.030)
pH: 6 (ref 5.0–8.0)

## 2016-02-06 ENCOUNTER — Ambulatory Visit (INDEPENDENT_AMBULATORY_CARE_PROVIDER_SITE_OTHER): Payer: Medicare Other | Admitting: Internal Medicine

## 2016-02-15 IMAGING — CT CT CERVICAL SPINE W/O CM
3 of 4 series · 13 of 33 positions shown, 16 images · non-contrast
Comparison: Multiple prior head CTs. Brain MRI 04/25/2006. No prior
cervical spine exam.

CLINICAL DATA: 74-year-old male who fell 3 weeks ago. Abnormal
gait. Initial encounter.

EXAM:
CT CERVICAL SPINE WITHOUT CONTRAST
TECHNIQUE: Multidetector CT imaging of the cervical spine was performed without
intravenous contrast. Multiplanar CT image reconstructions were also
generated.

[Series 5: cervical spine sagittal bone · sagittal · 0.33mm/px · 5 of 106 slices shown, 6 images]
[im 36/106  bone]
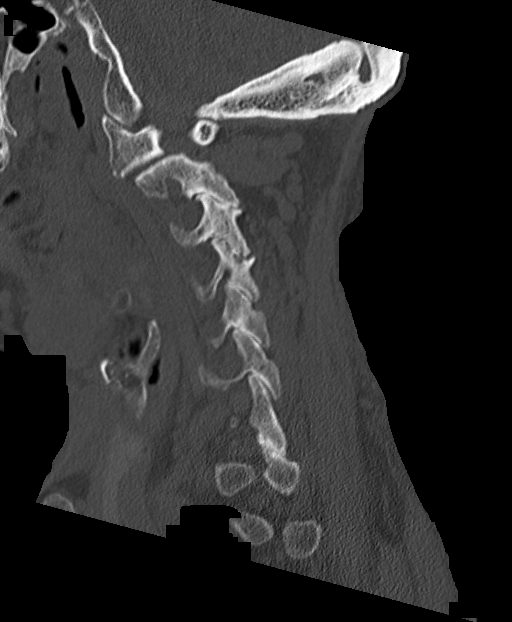
[im 44/106  bone]
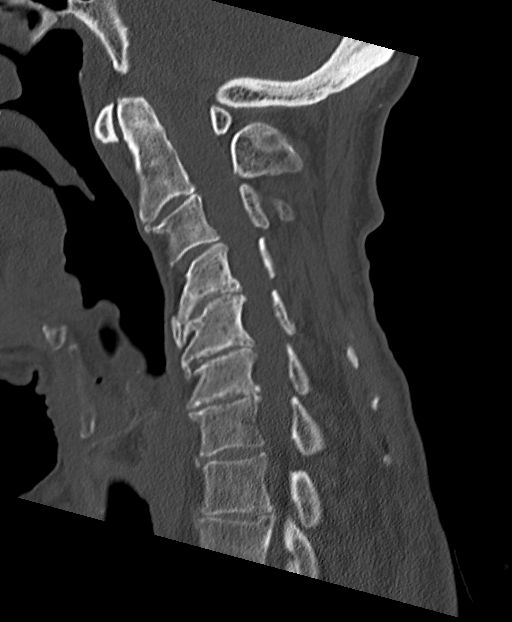
[im 53/106  soft-tissue]
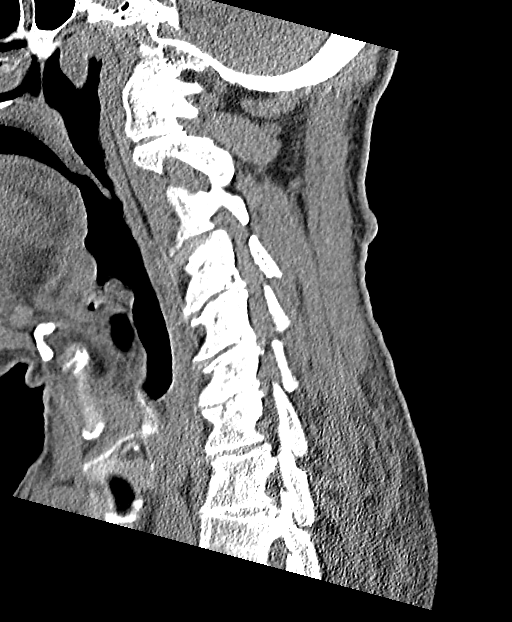
[im 53/106  bone]
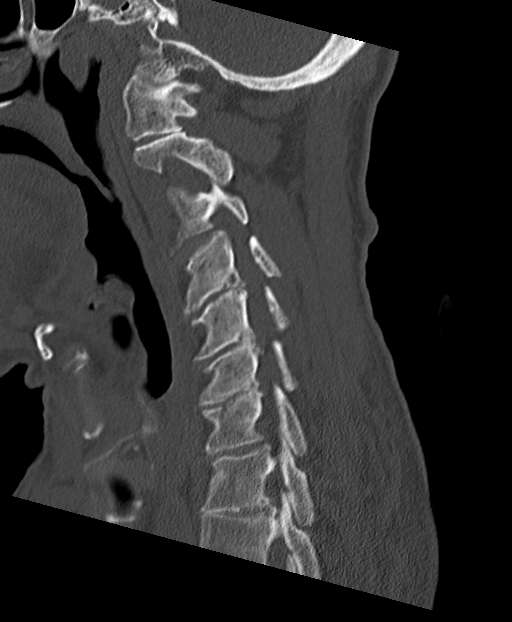
[im 62/106  bone]
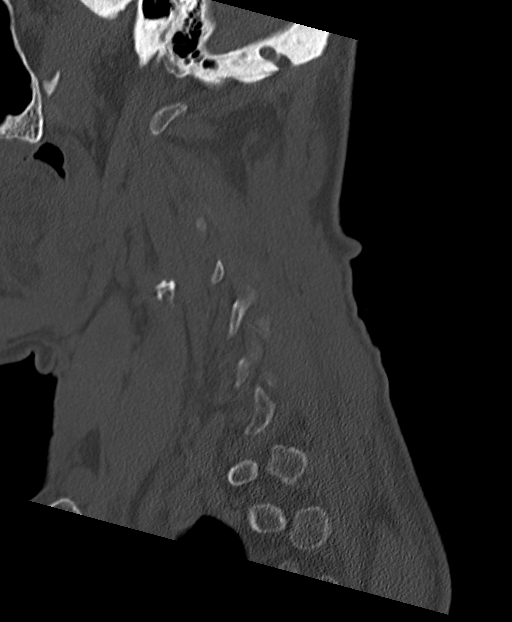
[im 71/106  bone]
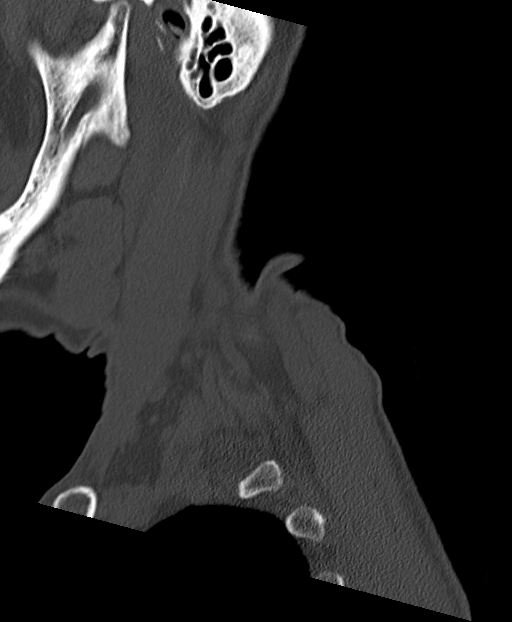

[Series 6: cervical spine coronal bone · coronal · 0.35mm/px · 3 of 69 slices shown]
[im 14/69  bone]
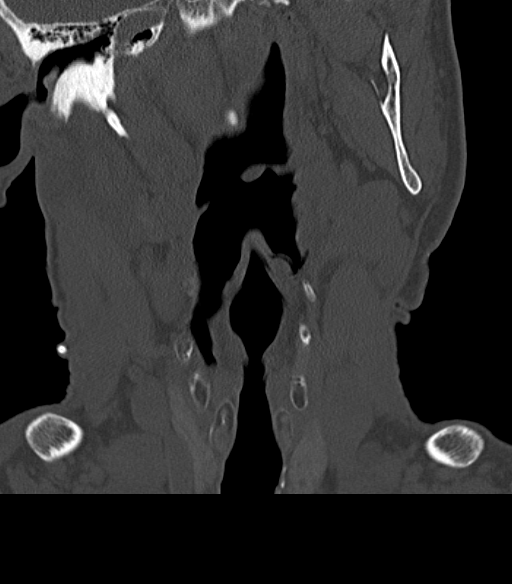
[im 28/69  bone]
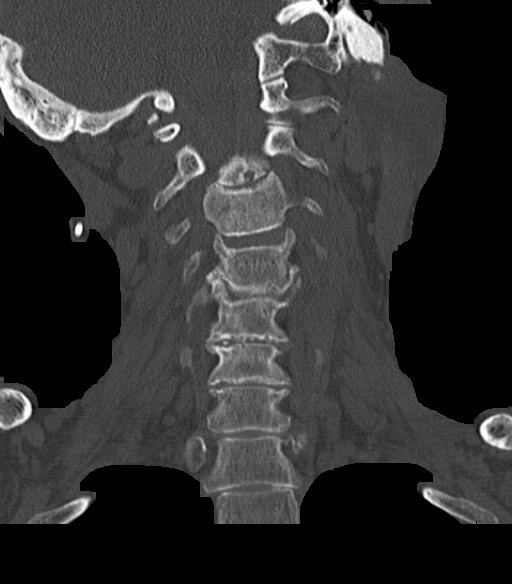
[im 41/69  bone]
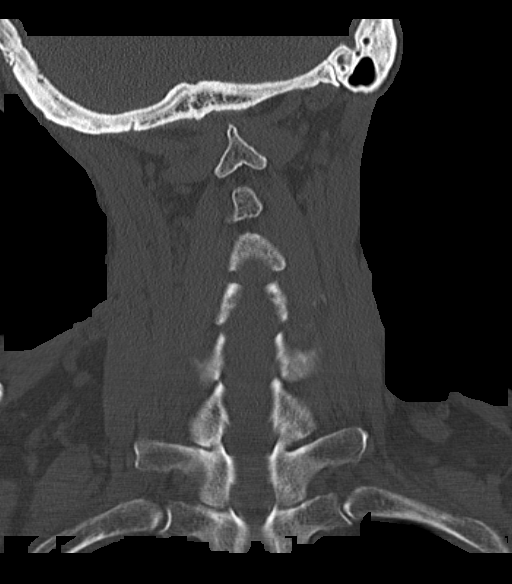

[Series 7: cervical spine axial bone · axial · 0.24mm/px · z∈[+133,+257]mm · 5 of 97 slices shown, 7 images]
[im 17/97  soft-tissue]
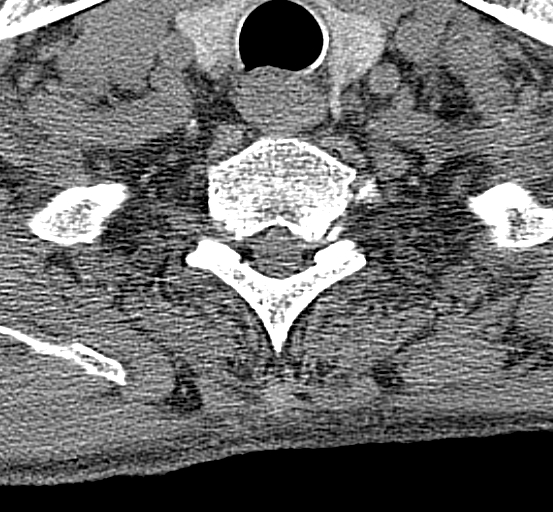
[im 17/97  bone]
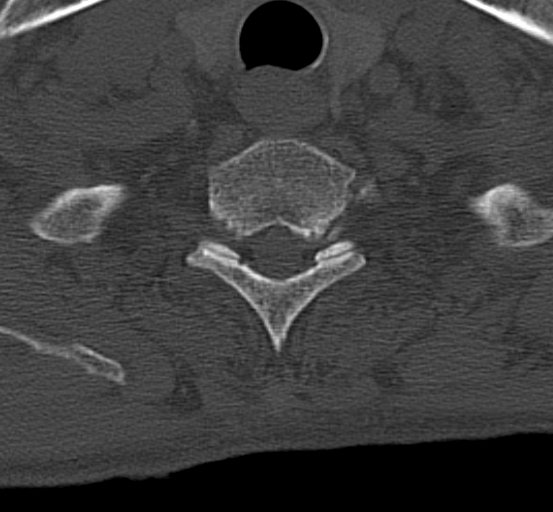
[im 33/97  bone]
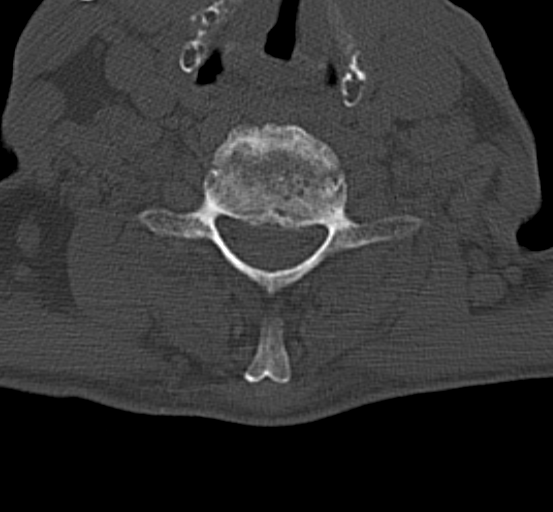
[im 49/97  bone]
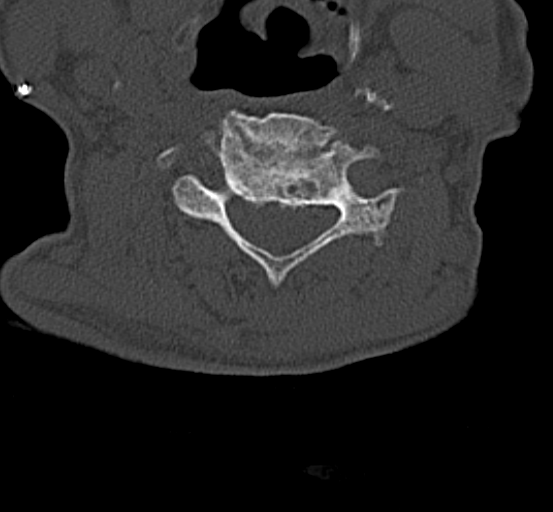
[im 65/97  bone]
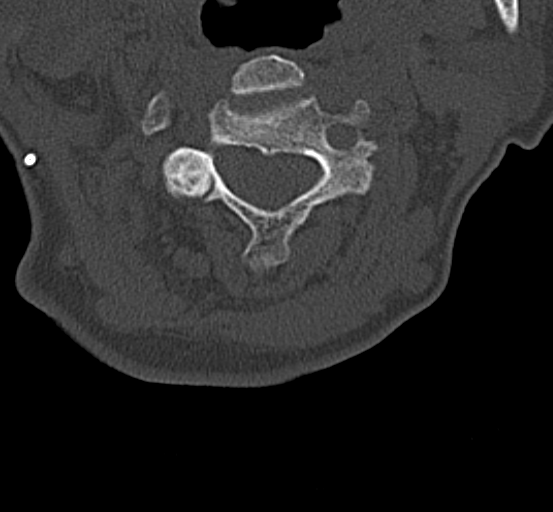
[im 81/97  soft-tissue]
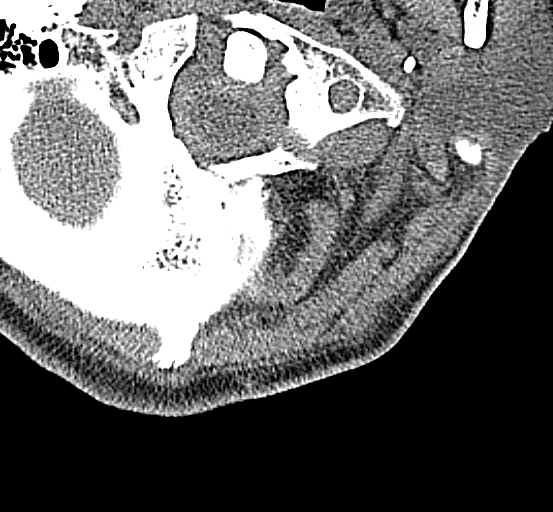
[im 81/97  bone]
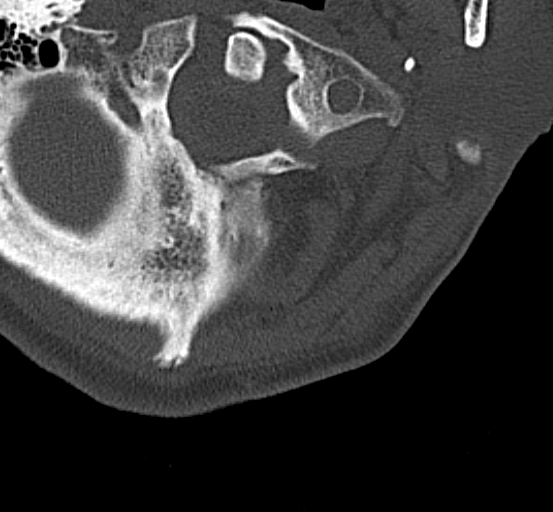

[13 of 33 positions shown; findings below may reference images not displayed]

FINDINGS: Reversal of cervical lordosis and rightward flexion of the neck at
the time of imaging. Visualized skull base is intact. No
atlanto-occipital dissociation. Bilateral posterior element
alignment is within normal limits. Cervicothoracic junction
alignment is within normal limits. No acute fracture or dislocation
identified in the cervical spine. There is a benign appearing round
lucency in the posterior superior C5 vertebral body which most
resembles a degenerative G 0 node or Schmorl node. Grossly intact
visualized upper thoracic levels.

Moderate to severe chronic cervical disc and endplate degeneration
from C4-C5 to C7-T1. Moderate to severe cervical facet hypertrophy
and degeneration on the right from C2-C3 to C5-C6.

Subsequent multifactorial at least mild cervical spinal stenosis
from C4-C5 to C6-C7, and moderate or severe neural foraminal
stenosis at the right C4, bilateral C5, bilateral C6, bilateral C7,
bilateral C8, and bilateral T1 nerve levels.

Also there is incompletely visible hyperdensity at the
cervicomedullary junction and C1 level (sagittal image 44 and series
3 images 21 through 25. This appears to result in cervicomedullary
junction stenosis, which appears stable from prior head CTs 7011 and
later. This is not correlated on the 1996 exam but might be
ligamentous in nature. The odontoid is intact. No degenerative
odontoid erosion or cysts.

Visible tympanic cavities and mastoids are clear. Partially visible
shunt catheter extending inferiorly in the right lateral neck
subcutaneous soft tissues. Negative lung apices. Calcified
atherosclerosis at both carotid bifurcations. Negative noncontrast
visualized deep soft tissue spaces of the neck. Partially visible
hyperostosis of the calvarium. Grossly stable visualized brain
parenchyma.
IMPRESSION: 1.  No acute osseous abnormality identified in the cervical spine.
2. Vague increased hyperdensity at the cervicomedullary junction,
nonspecific but favor degenerative ligamentous hypertrophy. Chronic
cervicomedullary junction and C1 level stenosis is suspected.
Cervical spine MRI would evaluate further.
3. Advanced disc and endplate degeneration from C4-C5 to the upper
thoracic spine, and right side upper cervical facet degeneration.
Subsequent spinal stenosis suspected from C4-C5 to C6-C7, and up to
severe multilevel cervical foraminal stenosis as detailed above.

## 2016-03-23 IMAGING — DX DG CHEST 1V
1 series · 1 of 1 positions shown · non-contrast
Comparison: October 17, 2011

CLINICAL DATA: Fever and altered mental status. History of colon
and prostate carcinoma

EXAM:
CHEST  1 VIEW

[chest ap strecther]
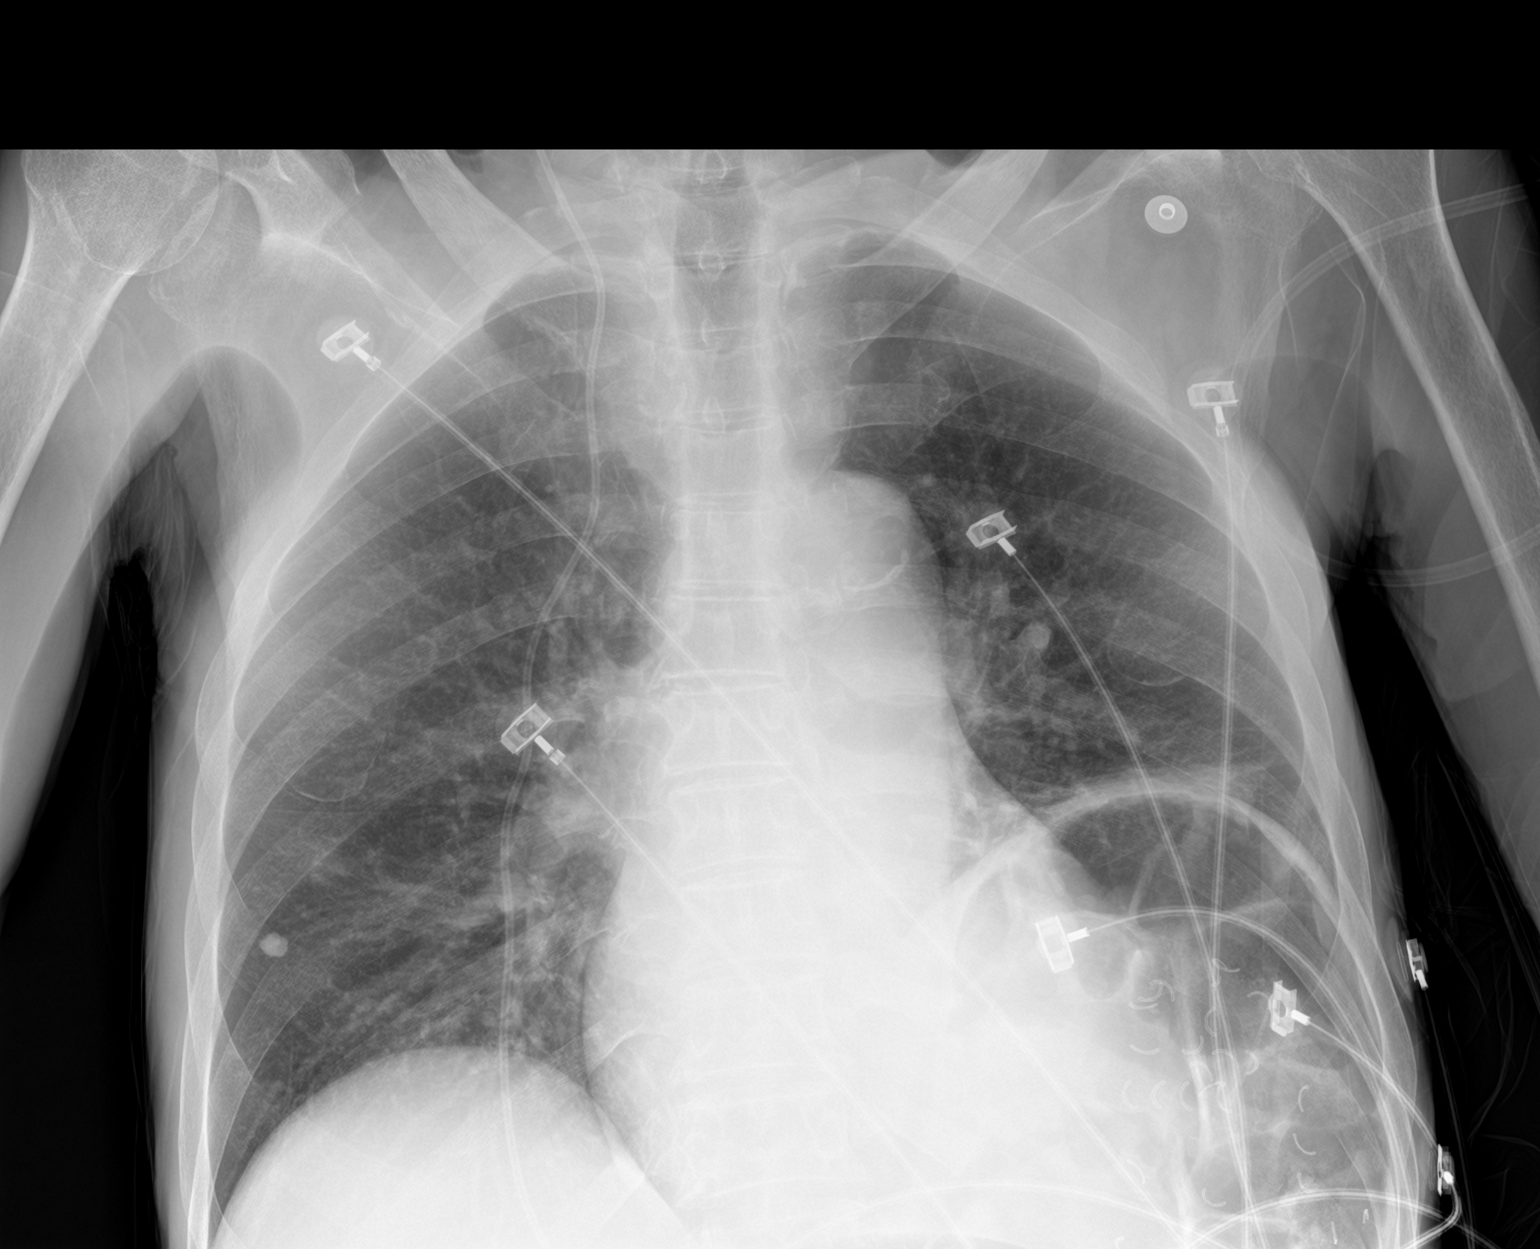

[1 of 1 positions shown; findings below may reference images not displayed]

FINDINGS: There is persistent elevation of the left hemidiaphragm. There is a
calcified granuloma in the right base. There is atelectatic change
in the left base. The lungs elsewhere clear. Heart is upper normal
in size with pulmonary vascularity within normal limits. No
adenopathy. A shunt extends along the anterior right hemithorax.
IMPRESSION: Atelectasis left base. Stable elevation left hemidiaphragm.
Calcified granuloma right base. No edema or consolidation. No change
in cardiac silhouette.

## 2016-04-01 ENCOUNTER — Telehealth: Payer: Self-pay | Admitting: Neurology

## 2016-04-01 NOTE — Telephone Encounter (Signed)
Pt has not been seen in [place] since May 2016. Will call to schedule appt.

## 2016-04-01 NOTE — Telephone Encounter (Signed)
Patient's wife is calling. The patient's PCP prescribed phenobarbital and he has been taking it for a month to help him sleep but it makes him too sleepy. The PCP advised the patient to call Dr. Jannifer Franklin and ask if he can stop taking this medication. Please call and discuss.

## 2016-04-02 NOTE — Telephone Encounter (Signed)
I called the patient. I talk with the wife. The patient was placed on phenobarbital taking 30 mg at night or sleep. The patient is too drowsy on the medication, he is staggery and falling around. The primary physician indicated to go down to 1 dose every other day for a week and then stop, this is reasonable.

## 2016-04-02 NOTE — Telephone Encounter (Signed)
Pt w/ h/o dementia, brain tumor and VP shunt was last seen 09/2014. On Dilantin for seizures. PT was ordered for gait training. Tried to call wife back to schedule appt but unable to reach her as the line was busy.

## 2016-04-18 DIAGNOSIS — N390 Urinary tract infection, site not specified: Secondary | ICD-10-CM | POA: Diagnosis not present

## 2016-06-18 DIAGNOSIS — D649 Anemia, unspecified: Secondary | ICD-10-CM | POA: Diagnosis not present

## 2016-06-18 DIAGNOSIS — Z6821 Body mass index (BMI) 21.0-21.9, adult: Secondary | ICD-10-CM | POA: Diagnosis not present

## 2016-06-18 DIAGNOSIS — J329 Chronic sinusitis, unspecified: Secondary | ICD-10-CM | POA: Diagnosis not present

## 2016-06-18 DIAGNOSIS — J449 Chronic obstructive pulmonary disease, unspecified: Secondary | ICD-10-CM | POA: Diagnosis not present

## 2016-06-18 DIAGNOSIS — Z1389 Encounter for screening for other disorder: Secondary | ICD-10-CM | POA: Diagnosis not present

## 2016-06-18 DIAGNOSIS — J42 Unspecified chronic bronchitis: Secondary | ICD-10-CM | POA: Diagnosis not present

## 2016-06-18 DIAGNOSIS — C189 Malignant neoplasm of colon, unspecified: Secondary | ICD-10-CM | POA: Diagnosis not present

## 2016-07-04 ENCOUNTER — Telehealth (INDEPENDENT_AMBULATORY_CARE_PROVIDER_SITE_OTHER): Payer: Self-pay | Admitting: Internal Medicine

## 2016-07-04 NOTE — Telephone Encounter (Signed)
THe Lactulose has been called into [facility].

## 2016-07-04 NOTE — Telephone Encounter (Signed)
Someone (did not leave name or relation) called for the patient to get Lactulose called in for him.  His regular doctor is not in this week.    [phone]

## 2016-07-05 NOTE — Telephone Encounter (Signed)
Patient's daughter Hebert Soho was informed.

## 2016-08-08 ENCOUNTER — Emergency Department (HOSPITAL_COMMUNITY): Payer: Medicare Other

## 2016-08-08 ENCOUNTER — Inpatient Hospital Stay (HOSPITAL_COMMUNITY)
Admission: EM | Admit: 2016-08-08 | Discharge: 2016-08-11 | DRG: 689 | Disposition: A | Discharge status: Home / Self Care | Payer: Medicare Other | Attending: Internal Medicine | Admitting: Internal Medicine

## 2016-08-08 ENCOUNTER — Encounter (HOSPITAL_COMMUNITY): Payer: Self-pay

## 2016-08-08 DIAGNOSIS — Z881 Allergy status to other antibiotic agents status: Secondary | ICD-10-CM | POA: Diagnosis not present

## 2016-08-08 DIAGNOSIS — B964 Proteus (mirabilis) (morganii) as the cause of diseases classified elsewhere: Secondary | ICD-10-CM | POA: Diagnosis present

## 2016-08-08 DIAGNOSIS — Z808 Family history of malignant neoplasm of other organs or systems: Secondary | ICD-10-CM

## 2016-08-08 DIAGNOSIS — Z23 Encounter for immunization: Secondary | ICD-10-CM | POA: Diagnosis not present

## 2016-08-08 DIAGNOSIS — Z85038 Personal history of other malignant neoplasm of large intestine: Secondary | ICD-10-CM | POA: Diagnosis not present

## 2016-08-08 DIAGNOSIS — R410 Disorientation, unspecified: Secondary | ICD-10-CM | POA: Diagnosis not present

## 2016-08-08 DIAGNOSIS — F0390 Unspecified dementia without behavioral disturbance: Secondary | ICD-10-CM | POA: Diagnosis present

## 2016-08-08 DIAGNOSIS — K5900 Constipation, unspecified: Secondary | ICD-10-CM | POA: Diagnosis not present

## 2016-08-08 DIAGNOSIS — Z8249 Family history of ischemic heart disease and other diseases of the circulatory system: Secondary | ICD-10-CM

## 2016-08-08 DIAGNOSIS — I700 Atherosclerosis of aorta: Secondary | ICD-10-CM | POA: Diagnosis not present

## 2016-08-08 DIAGNOSIS — Z85841 Personal history of malignant neoplasm of brain: Secondary | ICD-10-CM | POA: Diagnosis not present

## 2016-08-08 DIAGNOSIS — N390 Urinary tract infection, site not specified: Principal | ICD-10-CM | POA: Diagnosis present

## 2016-08-08 DIAGNOSIS — G40909 Epilepsy, unspecified, not intractable, without status epilepticus: Secondary | ICD-10-CM | POA: Diagnosis not present

## 2016-08-08 DIAGNOSIS — Z982 Presence of cerebrospinal fluid drainage device: Secondary | ICD-10-CM

## 2016-08-08 DIAGNOSIS — Z66 Do not resuscitate: Secondary | ICD-10-CM | POA: Diagnosis not present

## 2016-08-08 DIAGNOSIS — Z886 Allergy status to analgesic agent status: Secondary | ICD-10-CM | POA: Diagnosis not present

## 2016-08-08 DIAGNOSIS — Z888 Allergy status to other drugs, medicaments and biological substances status: Secondary | ICD-10-CM | POA: Diagnosis not present

## 2016-08-08 DIAGNOSIS — I6789 Other cerebrovascular disease: Secondary | ICD-10-CM | POA: Diagnosis not present

## 2016-08-08 DIAGNOSIS — E7800 Pure hypercholesterolemia, unspecified: Secondary | ICD-10-CM | POA: Diagnosis not present

## 2016-08-08 DIAGNOSIS — Z8673 Personal history of transient ischemic attack (TIA), and cerebral infarction without residual deficits: Secondary | ICD-10-CM | POA: Diagnosis not present

## 2016-08-08 DIAGNOSIS — Z87891 Personal history of nicotine dependence: Secondary | ICD-10-CM | POA: Diagnosis not present

## 2016-08-08 DIAGNOSIS — Z79899 Other long term (current) drug therapy: Secondary | ICD-10-CM | POA: Diagnosis not present

## 2016-08-08 DIAGNOSIS — Z9049 Acquired absence of other specified parts of digestive tract: Secondary | ICD-10-CM

## 2016-08-08 DIAGNOSIS — Z801 Family history of malignant neoplasm of trachea, bronchus and lung: Secondary | ICD-10-CM | POA: Diagnosis not present

## 2016-08-08 DIAGNOSIS — Z800 Family history of malignant neoplasm of digestive organs: Secondary | ICD-10-CM | POA: Diagnosis not present

## 2016-08-08 DIAGNOSIS — Z87440 Personal history of urinary (tract) infections: Secondary | ICD-10-CM | POA: Diagnosis not present

## 2016-08-08 DIAGNOSIS — Z8546 Personal history of malignant neoplasm of prostate: Secondary | ICD-10-CM

## 2016-08-08 DIAGNOSIS — R404 Transient alteration of awareness: Secondary | ICD-10-CM | POA: Diagnosis not present

## 2016-08-08 DIAGNOSIS — G9340 Encephalopathy, unspecified: Secondary | ICD-10-CM

## 2016-08-08 LAB — CBC
HEMATOCRIT: 39.2 % (ref 39.0–52.0)
HEMOGLOBIN: 13.3 g/dL (ref 13.0–17.0)
MCH: 30.9 pg (ref 26.0–34.0)
MCHC: 33.9 g/dL (ref 30.0–36.0)
MCV: 91 fL (ref 78.0–100.0)
Platelets: 175 10*3/uL (ref 150–400)
RBC: 4.31 MIL/uL (ref 4.22–5.81)
RDW: 13.2 % (ref 11.5–15.5)
WBC: 16.4 10*3/uL — AB (ref 4.0–10.5)

## 2016-08-08 LAB — DIFFERENTIAL
BASOS ABS: 0 10*3/uL (ref 0.0–0.1)
BASOS PCT: 0 %
EOS ABS: 0 10*3/uL (ref 0.0–0.7)
Eosinophils Relative: 0 %
Lymphocytes Relative: 7 %
Lymphs Abs: 1.1 10*3/uL (ref 0.7–4.0)
MONO ABS: 1.2 10*3/uL — AB (ref 0.1–1.0)
MONOS PCT: 7 %
Neutro Abs: 13.5 10*3/uL — ABNORMAL HIGH (ref 1.7–7.7)
Neutrophils Relative %: 86 %

## 2016-08-08 LAB — COMPREHENSIVE METABOLIC PANEL
ALK PHOS: 180 U/L — AB (ref 38–126)
ALT: 11 U/L — ABNORMAL LOW (ref 17–63)
ANION GAP: 8 (ref 5–15)
AST: 20 U/L (ref 15–41)
Albumin: 3.4 g/dL — ABNORMAL LOW (ref 3.5–5.0)
BUN: 11 mg/dL (ref 6–20)
CALCIUM: 8.7 mg/dL — AB (ref 8.9–10.3)
CO2: 29 mmol/L (ref 22–32)
Chloride: 98 mmol/L — ABNORMAL LOW (ref 101–111)
Creatinine, Ser: 0.82 mg/dL (ref 0.61–1.24)
GFR calc Af Amer: >60 mL/min (ref >60)
GLUCOSE: 116 mg/dL — AB (ref 65–99)
POTASSIUM: 3.3 mmol/L — AB (ref 3.5–5.1)
Sodium: 135 mmol/L (ref 135–145)
TOTAL PROTEIN: 6.8 g/dL (ref 6.5–8.1)
Total Bilirubin: 0.8 mg/dL (ref 0.3–1.2)

## 2016-08-08 LAB — URINALYSIS, ROUTINE W REFLEX MICROSCOPIC
Bilirubin Urine: NEGATIVE
Glucose, UA: NEGATIVE mg/dL
Ketones, ur: NEGATIVE mg/dL
Nitrite: POSITIVE — AB
PH: 5 (ref 5.0–8.0)
Protein, ur: NEGATIVE mg/dL
Specific Gravity, Urine: 1.023 (ref 1.005–1.030)

## 2016-08-08 LAB — PHENYTOIN LEVEL, TOTAL: Phenytoin Lvl: 11.1 ug/mL (ref 10.0–20.0)

## 2016-08-08 LAB — CBG MONITORING, ED: GLUCOSE-CAPILLARY: 85 mg/dL (ref 65–99)

## 2016-08-08 MED ORDER — ACETAMINOPHEN 325 MG PO TABS: 650.0000 mg | ORAL_TABLET | Freq: Four times a day (QID) | ORAL | Status: DC | PRN

## 2016-08-08 MED ORDER — POTASSIUM CHLORIDE CRYS ER 20 MEQ PO TBCR
40.0000 meq | EXTENDED_RELEASE_TABLET | Freq: Once | ORAL | Status: AC
  Administered 2016-08-08: 40 meq via ORAL
  Filled 2016-08-08: qty 2

## 2016-08-08 MED ORDER — DEXTROSE 5 % IV SOLN
1.0000 g | INTRAVENOUS | Status: DC
  Administered 2016-08-09 – 2016-08-10 (×2): 1 g via INTRAVENOUS
  Filled 2016-08-08 (×3): qty 10

## 2016-08-08 MED ORDER — SODIUM CHLORIDE 0.9 % IV SOLN
INTRAVENOUS | Status: DC
  Administered 2016-08-08 – 2016-08-11 (×5): via INTRAVENOUS

## 2016-08-08 MED ORDER — DEXTROSE 5 % IV SOLN
1.0000 g | Freq: Once | INTRAVENOUS | Status: AC
  Administered 2016-08-08: 1 g via INTRAVENOUS
  Filled 2016-08-08: qty 10

## 2016-08-08 MED ORDER — ONDANSETRON HCL 4 MG/2ML IJ SOLN: 4.0000 mg | Freq: Four times a day (QID) | INTRAMUSCULAR | Status: DC | PRN

## 2016-08-08 MED ORDER — SODIUM CHLORIDE 0.9 % IV BOLUS (SEPSIS)
500.0000 mL | Freq: Once | INTRAVENOUS | Status: AC
  Administered 2016-08-08: 500 mL via INTRAVENOUS

## 2016-08-08 MED ORDER — PNEUMOCOCCAL VAC POLYVALENT 25 MCG/0.5ML IJ INJ
0.5000 mL | INJECTION | INTRAMUSCULAR | Status: AC
  Administered 2016-08-09: 0.5 mL via INTRAMUSCULAR
  Filled 2016-08-08: qty 0.5

## 2016-08-08 MED ORDER — ONDANSETRON HCL 4 MG PO TABS: 4.0000 mg | ORAL_TABLET | Freq: Four times a day (QID) | ORAL | Status: DC | PRN

## 2016-08-08 MED ORDER — PHENYTOIN SODIUM EXTENDED 100 MG PO CAPS
200.0000 mg | ORAL_CAPSULE | Freq: Every day | ORAL | Status: DC
  Administered 2016-08-09 – 2016-08-11 (×3): 200 mg via ORAL
  Filled 2016-08-08 (×3): qty 2

## 2016-08-08 MED ORDER — PHENYTOIN SODIUM EXTENDED 100 MG PO CAPS
300.0000 mg | ORAL_CAPSULE | Freq: Every day | ORAL | Status: DC
  Administered 2016-08-08 – 2016-08-10 (×3): 300 mg via ORAL
  Filled 2016-08-08 (×3): qty 3

## 2016-08-08 MED ORDER — ENOXAPARIN SODIUM 40 MG/0.4ML ~~LOC~~ SOLN
40.0000 mg | SUBCUTANEOUS | Status: DC
  Administered 2016-08-08 – 2016-08-10 (×3): 40 mg via SUBCUTANEOUS
  Filled 2016-08-08 (×3): qty 0.4

## 2016-08-08 MED ORDER — DEXTROSE 5 % IV SOLN
1.0000 g | INTRAVENOUS | Status: DC
  Filled 2016-08-08: qty 10

## 2016-08-08 NOTE — ED Provider Notes (Signed)
[facility] DEPT Provider Note   CSN: [id number] Arrival date & time: 08/08/16  3875     History   Chief Complaint Chief Complaint  Patient presents with  . Altered Mental Status    HPI Jacob Wagner is a 76 y.o. male.  HPI Level V caveat due to altered mental status. 76 year old male who presents with AMS. History of thalamic brain tumor, seizure disorder on dilantin, colon cancer s/p right hemicolectomy, prior left temporal stroke, and dementia. Brought in by EMS for confusion. Per EMS, family states that he has been altered since 8 AM this morning. He has had history of UTI that may have presented similar in the past. Patient denies any pain but unable to answer any further questions  Past Medical History:  Diagnosis Date  . Abnormality of gait 09/20/2014  . Brain tumor ([place])    thalamus  . Colon cancer ([place])   . Dementia   . Dementia   . Hypercholesteremia   . Insomnia   . Memory difficulties 09/20/2014  . Prostate cancer ([place])   . Seizures ([place])   . Stroke [facility])    left temporal    Patient Active Problem List   Diagnosis Date Noted  . UTI (urinary tract infection) 11/19/2014  . E. coli UTI 11/19/2014  . Sepsis ([place]) 11/19/2014  . Protein-calorie malnutrition, severe ([place]) 11/17/2014  . Altered mental status 11/16/2014  . Abnormality of gait 09/20/2014  . Memory difficulties 09/20/2014  . Fall at home 03/22/2013  . Rhabdomyolysis 03/22/2013  . Acute encephalopathy 03/22/2013  . Seizure disorder ([place]) 03/22/2013  . Dementia 03/22/2013  . 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 Colon cancer ([place]) 04/16/2012  . Weight loss 04/16/2012  . Early satiety 04/16/2012  . Early satiety 04/16/2012    Past Surgical History:  Procedure Laterality Date  . Colon surgery 10 yrs ago    . FOOT SURGERY     left ankle ORIF/ fracture  . INGUINAL HERNIA REPAIR Right   . Prostate cancer    . shunt to head         Home Medications    Prior to  Admission medications   Medication Sig Start Date End Date Taking? Authorizing Provider  diphenoxylate-atropine (LOMOTIL) 2.5-0.025 MG per tablet Take 2 tablets by mouth every 6 (six) hours as needed for diarrhea or loose stools (for 5 days). Reported on 06/27/2015   Yes Historical Provider, MD  lactulose (CHRONULAC) 10 GM/15ML solution Take 15-30 mLs by mouth 4 (four) times daily as needed for mild constipation or moderate constipation.  11/04/14  Yes Historical Provider, MD  phenytoin (DILANTIN) 100 MG ER capsule Take 1 capsule (100 mg total) by mouth 2 (two) times daily. Patient taking differently: Take 200-300 mg by mouth 2 (two) times daily. 2am and 3pm 10/26/14  Yes Kathrynn Ducking, MD  acetaminophen (TYLENOL) 325 MG tablet Take 650 mg by mouth every 6 (six) hours as needed for mild pain, moderate pain or headache.     Historical Provider, MD    Family History Family History  Problem Relation Age of Onset  . Cancer Father     lung  . Benign prostatic hyperplasia Father   . Dementia Mother   . Hypertension Mother   . Hyperlipidemia Mother   . Stomach cancer Maternal Uncle   . Bone cancer Maternal Uncle     Social History Social History  Substance Use Topics  . Smoking status: Former Audiological scientist  date: 05/06/2009  . Smokeless tobacco: Never Used  . Alcohol use No     Allergies   Aricept [donepezil hcl]; Ciprocinonide [fluocinolone]; Flomax [tamsulosin hcl]; Ibuprofen; Levaquin [levofloxacin in d5w]; Lipitor [atorvastatin]; Namenda [memantine hcl]; and Other   Review of Systems Review of Systems  Unable to perform ROS: Mental status change     Physical Exam Updated Vital Signs BP (!) 107/56 (BP Location: Left Arm)   Pulse 76   Temp 99.3 F (37.4 C) (Oral)   Resp 20   Ht 6' (1.829 m)   Wt 150 lb (68 kg)   SpO2 95%   BMI 20.34 kg/m   Physical Exam Physical Exam  Nursing note and vitals reviewed. Constitutional: elderly man, thin, and in no acute distress Head:  Normocephalic and atraumatic.  Mouth/Throat: Oropharynx is clear and dry mucous membranes.  Neck: Normal range of motion. Neck supple.  Cardiovascular: Normal rate and regular rhythm.  no edema Pulmonary/Chest: Effort normal and breath sounds normal.  Abdominal: Soft. There is no tenderness. There is no rebound and no guarding.  Musculoskeletal: No deformities Neurological: somnolent, arouses to touch and says "no" when asked if in pain, does not obey commands, does not open eyes to command or spontaneously, withdrawals bilateral lower extremities to noxious stimuli, raises upper extremities to face spontaneously Skin: Skin is warm and dry.  Psychiatric: Cooperative   ED Treatments / Results  Labs (all labs ordered are listed, but only abnormal results are displayed) Labs Reviewed  COMPREHENSIVE METABOLIC PANEL - Abnormal; Notable for the following:       Result Value   Potassium 3.3 (*)    Chloride 98 (*)    Glucose, Bld 116 (*)    Calcium 8.7 (*)    Albumin 3.4 (*)    ALT 11 (*)    Alkaline Phosphatase 180 (*)    All other components within normal limits  CBC - Abnormal; Notable for the following:    WBC 16.4 (*)    All other components within normal limits  URINALYSIS, ROUTINE W REFLEX MICROSCOPIC - Abnormal; Notable for the following:    APPearance HAZY (*)    Hgb urine dipstick LARGE (*)    Nitrite POSITIVE (*)    Leukocytes, UA TRACE (*)    Bacteria, UA FEW (*)    Squamous Epithelial / LPF 0-5 (*)    All other components within normal limits  DIFFERENTIAL - Abnormal; Notable for the following:    Neutro Abs 13.5 (*)    Monocytes Absolute 1.2 (*)    All other components within normal limits  URINE CULTURE  PHENYTOIN LEVEL, TOTAL  CBG MONITORING, ED    EKG  EKG Interpretation  Date/Time:  Thursday August 08 2016 16:56:40 EDT Ventricular Rate:  74 PR Interval:    QRS Duration: 113 QT Interval:  362 QTC Calculation: 402 R Axis:   -33 Text Interpretation:   Sinus rhythm Borderline IVCD with LAD RSR' in V1 or V2, right VCD or RVH Nonspecific T abnormalities, lateral leads similar to prior EKG  Confirmed by Yareliz Thorstenson MD, Geneive Sandstrom [phone]) on 08/08/2016 5:01:04 PM       Radiology Dg Chest 2 View  Result Date: 08/08/2016 CLINICAL DATA:  Altered mental status EXAM: CHEST  2 VIEW COMPARISON:  Chest radiograph 11/16/2014 FINDINGS: Unchanged elevation of the left hemidiaphragm. There is a shunt catheter with tubing running from the right neck inferiorly along the right hemithorax and beyond the field of view. There is calcification in the  aortic arch. No focal airspace consolidation or pulmonary edema. No pneumothorax or pleural effusion. Unchanged calcified granuloma of the right lung base. IMPRESSION: No active cardiopulmonary disease. Unchanged calcific aortic atherosclerosis and elevation of the left hemidiaphragm. Electronically Signed   By: Ulyses Jarred M.D.   On: 08/08/2016 18:07   Ct Head Wo Contrast  Result Date: 08/08/2016 CLINICAL DATA:  Altered mental status. History of alignment brain tumor. Seizure disorder. EXAM: CT HEAD WITHOUT CONTRAST TECHNIQUE: Contiguous axial images were obtained from the base of the skull through the vertex without intravenous contrast. COMPARISON:  11/16/2014.  03/25/2014. FINDINGS: Brain: There is no evidence for acute hemorrhage, hydrocephalus, mass lesion, or abnormal extra-axial fluid collection. No definite CT evidence for acute infarction. Diffuse loss of parenchymal volume is consistent with atrophy. Patchy low attenuation in the deep hemispheric and periventricular white matter is nonspecific, but likely reflects chronic microvascular ischemic demyelination. Right frontal ventriculostomy shunt catheter is stable with the tip position across the midline, unchanged. Calcification in the region of the left thalamus posteriorly is stable. Vascular: No hyperdense vessel or unexpected calcification. Skull: Normal. Negative for fracture  or focal lesion. Sinuses/Orbits: No acute finding. Other: None. IMPRESSION: 1. Stable exam.  No new or progressive interval findings. 2. Atrophy with chronic small vessel white matter ischemic disease. 3. Dystrophic calcification in the region of the posterior left thalamus, unchanged. 4. Stable position of the right frontal ventriculostomy shunt catheter. Electronically Signed   By: Misty Stanley M.D.   On: 08/08/2016 18:33    Procedures Procedures (including critical care time) CRITICAL CARE Performed by: Forde Dandy   Total critical care time: 35 minutes  Critical care time was exclusive of separately billable procedures and treating other patients.  Critical care was necessary to treat or prevent imminent or life-threatening deterioration.  Critical care was time spent personally by me on the following activities: development of treatment plan with patient and/or surrogate as well as nursing, discussions with consultants, evaluation of patient's response to treatment, examination of patient, obtaining history from patient or surrogate, ordering and performing treatments and interventions, ordering and review of laboratory studies, ordering and review of radiographic studies, pulse oximetry and re-evaluation of patient's condition.  Medications Ordered in ED Medications  cefTRIAXone (ROCEPHIN) 1 g in dextrose 5 % 50 mL IVPB (1 g Intravenous New Bag/Given 08/08/16 1924)  sodium chloride 0.9 % bolus 500 mL (0 mLs Intravenous Stopped 08/08/16 1805)  sodium chloride 0.9 % bolus 500 mL (500 mLs Intravenous New Bag/Given 08/08/16 1804)     Initial Impression / Assessment and Plan / ED Course  I have reviewed the triage vital signs and the nursing notes.  Pertinent labs & imaging results that were available during my care of the patient were reviewed by me and considered in my medical decision making (see chart for details).     Presenting with AMS, differential including post-ictal from  seizures, metabolic or electrolyte derangements, infection.  Afebrile, with soft blood pressures, responsive to fluids. In no distress. Is altered on exam, GCS of about 9-10. Does not appear to have focal neuro deficits.   CT head visualized and does not show acute intracranial processes. Chest x-ray visualized and shows no infiltrate or other acute cardiopulmonary processes. Blood work notable for leukocytosis of 16 and a catheterized urine that is suggestive of a UTI which is likely causing his delirium. No other major electrolyte or metabolic derangements. Spoke with Dr. Darrick Meigs, who will admit to hospitalist service.  Final  Clinical Impressions(s) / ED Diagnoses   Final diagnoses:  Lower urinary tract infectious disease  Delirium  Encephalopathy    New Prescriptions New Prescriptions   No medications on file     Forde Dandy, MD 08/08/16 [phone]

## 2016-08-08 NOTE — ED Triage Notes (Signed)
Patient brought in by [facility] from home for altered mental status. Per family at has been altered since this morning at 0800.  Patient will respond to limited verbal and painful Stimuli.  Per EMS, patient has had several UTIs and appears this way.

## 2016-08-08 NOTE — H&P (Signed)
TRH H&P    Patient Demographics:    Jacob Wagner, is a 76 y.o. male  MRN: [id number]  DOB - Jul 20, 1940  Admit Date - 08/08/2016  Referring MD/NP/PA: Dr Oleta Mouse  Outpatient Primary MD for the patient is Glo Herring, MD  Patient coming from: home  Chief Complaint  Patient presents with  . Altered Mental Status      HPI:    Jacob Wagner  is a 76 y.o. male, With history of thalamic brain tumor, seizure disorder on Dilantin, colon cancer status post right hemicolectomy, prior left temporal stroke, dementia, VP shunt status was brought to the ED for confusion. Family said that patient has been not acting his usual self since this morning. Patient has a history of UTI with similar symptoms in the past. Patient is unable to provide any history and does not open his eyes. Not cooperative with examination.  There is no history of nausea vomiting or diarrhea. No chest pain or shortness of breath. As per daughter patient usually talks a little, recognizes family members, but has not been doing that since this morning.    Review of systems:    Review of systems as above, other review of systems unobtainable due to patient's altered mental status.   With Past History of the following :    Past Medical History:  Diagnosis Date  . Abnormality of gait 09/20/2014  . Brain tumor ([place])    thalamus  . Colon cancer ([place])   . Dementia   . Dementia   . Hypercholesteremia   . Insomnia   . Memory difficulties 09/20/2014  . Prostate cancer ([place])   . Seizures ([place])   . Stroke [facility])    left temporal      Past Surgical History:  Procedure Laterality Date  . Colon surgery 10 yrs ago    . FOOT SURGERY     left ankle ORIF/ fracture  . INGUINAL HERNIA REPAIR Right   . Prostate cancer    . shunt to head        Social History:      Social History  Substance Use Topics  . Smoking status: Former Smoker   Quit date: 05/06/2009  . Smokeless tobacco: Never Used  . Alcohol use No       Family History :     Family History  Problem Relation Age of Onset  . Cancer Father     lung  . Benign prostatic hyperplasia Father   . Dementia Mother   . Hypertension Mother   . Hyperlipidemia Mother   . Stomach cancer Maternal Uncle   . Bone cancer Maternal Uncle       Home Medications:   Prior to Admission medications   Medication Sig Start Date End Date Taking? Authorizing Provider  diphenoxylate-atropine (LOMOTIL) 2.5-0.025 MG per tablet Take 2 tablets by mouth every 6 (six) hours as needed for diarrhea or loose stools (for 5 days). Reported on 06/27/2015   Yes Historical Provider, MD  lactulose (CHRONULAC) 10 GM/15ML solution Take 15-30 mLs by  mouth 4 (four) times daily as needed for mild constipation or moderate constipation.  11/04/14  Yes Historical Provider, MD  phenytoin (DILANTIN) 100 MG ER capsule Take 1 capsule (100 mg total) by mouth 2 (two) times daily. Patient taking differently: Take 200-300 mg by mouth 2 (two) times daily. 2am and 3pm 10/26/14  Yes Kathrynn Ducking, MD  acetaminophen (TYLENOL) 325 MG tablet Take 650 mg by mouth every 6 (six) hours as needed for mild pain, moderate pain or headache.     Historical Provider, MD     Allergies:     Allergies  Allergen Reactions  . Aricept [Donepezil Hcl] Diarrhea  . Ciprocinonide [Fluocinolone]   . Flomax [Tamsulosin Hcl]   . Ibuprofen   . Levaquin [Levofloxacin In D5w]   . Lipitor [Atorvastatin]     dizziness  . Namenda [Memantine Hcl]     itch  . Other     Wife states NO MRI due to shunt in pts head.      Physical Exam:   Vitals  Blood pressure (!) 107/56, pulse 76, temperature 99.3 F (37.4 C), temperature source Oral, resp. rate 20, height 6' (1.829 m), weight 68 kg (150 lb), SpO2 95 %.  1.  General: Appears in no acute distress,   2. Psychiatric: Alert, keeps eyes closed, not cooperative with examination. Does  not answer questions.  3. Neurologic: Not tested as patient is not cooperative  4. Eyes : Not examined  5. ENMT:  moist mucous membranes   6. Neck:  supple, no cervical lymphadenopathy appriciated, No thyromegaly  7. Respiratory : Normal respiratory effort, good air movement bilaterally,clear to  auscultation bilaterally  8. Cardiovascular : RRR, no gallops, rubs or murmurs, no leg edema  9. Gastrointestinal:  Positive bowel sounds, abdomen soft, non-tender to palpation,no hepatosplenomegaly, no rigidity or guarding       10. Skin:  No cyanosis, normal texture and turgor, no rash, lesions or ulcers  11.Musculoskeletal:  Good muscle tone,  joints appear normal , no effusions,  normal range of motion    Data Review:    CBC  Recent Labs Lab 08/08/16 1700  WBC 16.4*  HGB 13.3  HCT 39.2  PLT 175  MCV 91.0  MCH 30.9  MCHC 33.9  RDW 13.2  LYMPHSABS 1.1  MONOABS 1.2*  EOSABS 0.0  BASOSABS 0.0   ------------------------------------------------------------------------------------------------------------------  Chemistries   Recent Labs Lab 08/08/16 1700  NA 135  K 3.3*  CL 98*  CO2 29  GLUCOSE 116*  BUN 11  CREATININE 0.82  CALCIUM 8.7*  AST 20  ALT 11*  ALKPHOS 180*  BILITOT 0.8   ------------------------------------------------------------------------------------------------------------------  ------------------------------------------------------------------------------------------------------------------ GFR: Estimated Creatinine Clearance: 73.7 mL/min (by C-G formula based on SCr of 0.82 mg/dL). Liver Function Tests:  Recent Labs Lab 08/08/16 1700  AST 20  ALT 11*  ALKPHOS 180*  BILITOT 0.8  PROT 6.8  ALBUMIN 3.4*   CBG:  Recent Labs Lab 08/08/16 1709  GLUCAP 85    --------------------------------------------------------------------------------------------------------------- Urine analysis:    Component Value Date/Time    COLORURINE YELLOW 08/08/2016 1700   APPEARANCEUR HAZY (A) 08/08/2016 1700   LABSPEC 1.023 08/08/2016 1700   PHURINE 5.0 08/08/2016 1700   GLUCOSEU NEGATIVE 08/08/2016 1700   HGBUR LARGE (A) 08/08/2016 1700   BILIRUBINUR NEGATIVE 08/08/2016 1700   KETONESUR NEGATIVE 08/08/2016 1700   PROTEINUR NEGATIVE 08/08/2016 1700   UROBILINOGEN 4.0 (H) 11/16/2014 1220   NITRITE POSITIVE (A) 08/08/2016 1700   LEUKOCYTESUR TRACE (A) 08/08/2016  1700      Imaging Results:    Dg Chest 2 View  Result Date: 08/08/2016 CLINICAL DATA:  Altered mental status EXAM: CHEST  2 VIEW COMPARISON:  Chest radiograph 11/16/2014 FINDINGS: Unchanged elevation of the left hemidiaphragm. There is a shunt catheter with tubing running from the right neck inferiorly along the right hemithorax and beyond the field of view. There is calcification in the aortic arch. No focal airspace consolidation or pulmonary edema. No pneumothorax or pleural effusion. Unchanged calcified granuloma of the right lung base. IMPRESSION: No active cardiopulmonary disease. Unchanged calcific aortic atherosclerosis and elevation of the left hemidiaphragm. Electronically Signed   By: Ulyses Jarred M.D.   On: 08/08/2016 18:07   Ct Head Wo Contrast  Result Date: 08/08/2016 CLINICAL DATA:  Altered mental status. History of alignment brain tumor. Seizure disorder. EXAM: CT HEAD WITHOUT CONTRAST TECHNIQUE: Contiguous axial images were obtained from the base of the skull through the vertex without intravenous contrast. COMPARISON:  11/16/2014.  03/25/2014. FINDINGS: Brain: There is no evidence for acute hemorrhage, hydrocephalus, mass lesion, or abnormal extra-axial fluid collection. No definite CT evidence for acute infarction. Diffuse loss of parenchymal volume is consistent with atrophy. Patchy low attenuation in the deep hemispheric and periventricular white matter is nonspecific, but likely reflects chronic microvascular ischemic demyelination. Right frontal  ventriculostomy shunt catheter is stable with the tip position across the midline, unchanged. Calcification in the region of the left thalamus posteriorly is stable. Vascular: No hyperdense vessel or unexpected calcification. Skull: Normal. Negative for fracture or focal lesion. Sinuses/Orbits: No acute finding. Other: None. IMPRESSION: 1. Stable exam.  No new or progressive interval findings. 2. Atrophy with chronic small vessel white matter ischemic disease. 3. Dystrophic calcification in the region of the posterior left thalamus, unchanged. 4. Stable position of the right frontal ventriculostomy shunt catheter. Electronically Signed   By: Misty Stanley M.D.   On: 08/08/2016 18:33    My personal review of EKG: Rhythm NSR   Assessment & Plan:    Active Problems:   Seizure disorder (HCC)   Dementia   VP (ventriculoperitoneal) shunt status   UTI (urinary tract infection)   1. Altered mental status- secondary to UTI, patient started on IV ceftriaxone. Urine culture has been obtained. Follow urine culture results. CT head showed no acute findings. Shows stable position of right frontal ventriculostomy  shunt catheter. 2. UTI-patient has abnormal UA with possible nitrite, started on ceftriaxone. Urine culture has been obtained. Follow urine culture results. 3. Dementia- worsening of mental status as above. No behavior disturbance at this time. 4. Seizure disorder- Dilantin level is 11.1, continue phenytoin 200 mg in the morning and 300 mg at night   DVT Prophylaxis-   Lovenox   AM Labs Ordered, also please review Full Orders  Family Communication: Admission, patients condition and plan of care including tests being ordered have been discussed with the patient 's daughter at bedside who indicate understanding and agree with the plan and Code Status.  Code Status:  DNR  Admission status: Observation  Time spent in minutes : 60 minutes   Abbigal Radich S M.D on 08/08/2016 at 8:20 PM  Between  7am to 7pm - Pager - [phone]. After 7pm go to [email or web address] - password [facility]  [facility] - Office  [phone]

## 2016-08-08 NOTE — Progress Notes (Signed)
Pharmacy Antibiotic Note  Jacob Wagner is a 76 y.o. male admitted on 08/08/2016 with UTI.  Pharmacy has been consulted for ceftriaxone dosing.  Plan: ceftriaxone 1gm IV q24h F/U cx and clinical progress [place] as tx indicated  Height: 6' (182.9 cm) Weight: 150 lb (68 kg) IBW/kg (Calculated) : 77.6  Temp (24hrs), Avg:99.3 F (37.4 C), Min:99.3 F (37.4 C), Max:99.3 F (37.4 C)   Recent Labs Lab 08/08/16 1700  WBC 16.4*  CREATININE 0.82    Estimated Creatinine Clearance: 73.7 mL/min (by C-G formula based on SCr of 0.82 mg/dL).    Allergies  Allergen Reactions  . Aricept [Donepezil Hcl] Diarrhea  . Ciprocinonide [Fluocinolone]   . Flomax [Tamsulosin Hcl]   . Ibuprofen   . Levaquin [Levofloxacin In D5w]   . Lipitor [Atorvastatin]     dizziness  . Namenda [Memantine Hcl]     itch  . Other     Wife states NO MRI due to shunt in pts head.     Antimicrobials this admission: Ceftriaxone 4/5 >>   Microbiology results: 4/5 UCx: pending  Thank you for allowing pharmacy to be a part of this patient's care.  Isac Sarna, BS [place], [place] Clinical Pharmacist Pager [phone]  08/08/2016 9:30 PM

## 2016-08-09 DIAGNOSIS — G40909 Epilepsy, unspecified, not intractable, without status epilepticus: Secondary | ICD-10-CM | POA: Diagnosis not present

## 2016-08-09 DIAGNOSIS — Z8673 Personal history of transient ischemic attack (TIA), and cerebral infarction without residual deficits: Secondary | ICD-10-CM | POA: Diagnosis not present

## 2016-08-09 DIAGNOSIS — Z888 Allergy status to other drugs, medicaments and biological substances status: Secondary | ICD-10-CM | POA: Diagnosis not present

## 2016-08-09 DIAGNOSIS — B964 Proteus (mirabilis) (morganii) as the cause of diseases classified elsewhere: Secondary | ICD-10-CM | POA: Diagnosis present

## 2016-08-09 DIAGNOSIS — K5900 Constipation, unspecified: Secondary | ICD-10-CM | POA: Diagnosis present

## 2016-08-09 DIAGNOSIS — Z66 Do not resuscitate: Secondary | ICD-10-CM | POA: Diagnosis present

## 2016-08-09 DIAGNOSIS — Z85038 Personal history of other malignant neoplasm of large intestine: Secondary | ICD-10-CM | POA: Diagnosis not present

## 2016-08-09 DIAGNOSIS — G9340 Encephalopathy, unspecified: Secondary | ICD-10-CM

## 2016-08-09 DIAGNOSIS — F0390 Unspecified dementia without behavioral disturbance: Secondary | ICD-10-CM | POA: Diagnosis not present

## 2016-08-09 DIAGNOSIS — Z8249 Family history of ischemic heart disease and other diseases of the circulatory system: Secondary | ICD-10-CM | POA: Diagnosis not present

## 2016-08-09 DIAGNOSIS — Z881 Allergy status to other antibiotic agents status: Secondary | ICD-10-CM | POA: Diagnosis not present

## 2016-08-09 DIAGNOSIS — Z808 Family history of malignant neoplasm of other organs or systems: Secondary | ICD-10-CM | POA: Diagnosis not present

## 2016-08-09 DIAGNOSIS — N390 Urinary tract infection, site not specified: Principal | ICD-10-CM

## 2016-08-09 DIAGNOSIS — Z886 Allergy status to analgesic agent status: Secondary | ICD-10-CM | POA: Diagnosis not present

## 2016-08-09 DIAGNOSIS — Z982 Presence of cerebrospinal fluid drainage device: Secondary | ICD-10-CM | POA: Diagnosis not present

## 2016-08-09 DIAGNOSIS — Z800 Family history of malignant neoplasm of digestive organs: Secondary | ICD-10-CM | POA: Diagnosis not present

## 2016-08-09 DIAGNOSIS — Z8546 Personal history of malignant neoplasm of prostate: Secondary | ICD-10-CM | POA: Diagnosis not present

## 2016-08-09 DIAGNOSIS — E7800 Pure hypercholesterolemia, unspecified: Secondary | ICD-10-CM | POA: Diagnosis present

## 2016-08-09 DIAGNOSIS — Z801 Family history of malignant neoplasm of trachea, bronchus and lung: Secondary | ICD-10-CM | POA: Diagnosis not present

## 2016-08-09 DIAGNOSIS — Z23 Encounter for immunization: Secondary | ICD-10-CM | POA: Diagnosis not present

## 2016-08-09 DIAGNOSIS — Z79899 Other long term (current) drug therapy: Secondary | ICD-10-CM | POA: Diagnosis not present

## 2016-08-09 DIAGNOSIS — Z87440 Personal history of urinary (tract) infections: Secondary | ICD-10-CM | POA: Diagnosis not present

## 2016-08-09 DIAGNOSIS — Z87891 Personal history of nicotine dependence: Secondary | ICD-10-CM | POA: Diagnosis not present

## 2016-08-09 DIAGNOSIS — Z85841 Personal history of malignant neoplasm of brain: Secondary | ICD-10-CM | POA: Diagnosis not present

## 2016-08-09 LAB — CBC
HEMATOCRIT: 39.4 % (ref 39.0–52.0)
HEMOGLOBIN: 13.1 g/dL (ref 13.0–17.0)
MCH: 30.4 pg (ref 26.0–34.0)
MCHC: 33.2 g/dL (ref 30.0–36.0)
MCV: 91.4 fL (ref 78.0–100.0)
Platelets: 150 10*3/uL (ref 150–400)
RBC: 4.31 MIL/uL (ref 4.22–5.81)
RDW: 13.4 % (ref 11.5–15.5)
WBC: 18.7 10*3/uL — ABNORMAL HIGH (ref 4.0–10.5)

## 2016-08-09 LAB — COMPREHENSIVE METABOLIC PANEL
ALK PHOS: 149 U/L — AB (ref 38–126)
ALT: 10 U/L — AB (ref 17–63)
ANION GAP: 8 (ref 5–15)
AST: 18 U/L (ref 15–41)
Albumin: 2.9 g/dL — ABNORMAL LOW (ref 3.5–5.0)
BUN: 10 mg/dL (ref 6–20)
CALCIUM: 8.3 mg/dL — AB (ref 8.9–10.3)
CO2: 28 mmol/L (ref 22–32)
CREATININE: 0.73 mg/dL (ref 0.61–1.24)
Chloride: 100 mmol/L — ABNORMAL LOW (ref 101–111)
Glucose, Bld: 106 mg/dL — ABNORMAL HIGH (ref 65–99)
Potassium: 4 mmol/L (ref 3.5–5.1)
Sodium: 136 mmol/L (ref 135–145)
TOTAL PROTEIN: 6 g/dL — AB (ref 6.5–8.1)
Total Bilirubin: 1 mg/dL (ref 0.3–1.2)

## 2016-08-09 MED ORDER — PRO-STAT SUGAR FREE PO LIQD
30.0000 mL | Freq: Three times a day (TID) | ORAL | Status: DC
  Administered 2016-08-10 – 2016-08-11 (×3): 30 mL via ORAL
  Filled 2016-08-09 (×5): qty 30

## 2016-08-09 MED ORDER — BOOST / RESOURCE BREEZE PO LIQD
1.0000 | Freq: Three times a day (TID) | ORAL | Status: DC
  Administered 2016-08-10 (×2): 1 via ORAL

## 2016-08-09 MED ORDER — DIPHENOXYLATE-ATROPINE 2.5-0.025 MG PO TABS
1.0000 | ORAL_TABLET | Freq: Four times a day (QID) | ORAL | Status: DC | PRN
  Administered 2016-08-09: 1 via ORAL
  Filled 2016-08-09: qty 1

## 2016-08-09 NOTE — Progress Notes (Signed)
Initial Nutrition Assessment   INTERVENTION:  Boost Breeze po TID, each supplement provides 250 kcal and 9 grams of protein   ProStat 30 ml TID (each 30 ml provides 100 kcal, 15 gr protein)   Nursing to assess pt need for feeding assistance and oral supplement intake  NUTRITION DIAGNOSIS:   Inadequate oral intake related to lethargy/confusion (increased confusion compared to baseline dementia) as evidenced by per patient/family report.   GOAL:   Other (Comment) (Meet estimated needs as able given pt has significant demenita)   MONITOR:  Po intake, labs and wt trends     REASON FOR ASSESSMENT:   Malnutrition Screening Tool    ASSESSMENT:  Patient has hx of dementia, brain tumor, colon cancer and stroke. He is thrashing around in the bed on RD arrival and family is here trying to calm him.  They say he had been eating well -regular (no dairy)diet up until this acute altered mental status. RD assessed pt back 11/16/14 when he was hospitalized. At that time he had acute wt loss down to 127 (57.7 kg). His wt has rebounded to current 65 kg.   He was NPO earlier this morning but now diet has advanced fully. He will be offered (non-dairy option) oral supplement and nutrition services are to co-ordinate with family to obtain his food choices.  He is at risk for weight loss and worsening malnutrition based on his medical problems and current acute change in mentation.  Unable to complete Nutrition-Focused physical exam at this time due to pt restlessness  Recent Labs Lab 08/08/16 1700 08/09/16 0632  NA 135 136  K 3.3* 4.0  CL 98* 100*  CO2 29 28  BUN 11 10  CREATININE 0.82 0.73  CALCIUM 8.7* 8.3*  GLUCOSE 116* 106*   Labs and Meds: reviewed  Diet Order:  Diet regular Room service appropriate? Yes; Fluid consistency: Thin  Skin:  Reviewed, no issues  Last BM:  08/08/16  Height:   Ht Readings from Last 1 Encounters:  08/08/16 6' (1.829 m)    Weight:   Wt Readings  from Last 1 Encounters:  08/08/16 143 lb 8.3 oz (65.1 kg)    Ideal Body Weight:  81 kg  BMI:  Body mass index is 19.46 kg/m.  Estimated Nutritional Needs:   Kcal:  1157-2620 (30-35 kcal/kg)  Protein:  78-85 gr (1.2-1.3 gr/kg)  Fluid:  2.0 liters daily  EDUCATION NEEDS: none at this time    Colman Cater MS,RD,CSG,LDN Office: #[phone] Pager: [phone]

## 2016-08-09 NOTE — Progress Notes (Signed)
PROGRESS NOTE    Jacob Wagner  MWN:[id number] DOB: 1940/11/15 DOA: 08/08/2016 PCP: Glo Herring, MD   Brief Narrative:  43 rolled male with a history of seizure disorder, dementia, prior stroke who was brought to the hospital with change in his mental status. He was unhappy in her tract infection and started on intravenous antibiotics. Anticipate discharge home the next 24 hours if mental status begins to return to baseline.   Assessment & Plan:   Active Problems:   Seizure disorder (HCC)   Dementia   VP (ventriculoperitoneal) shunt status   UTI (urinary tract infection)   1. Acute encephalopathy . related to urinary tract infection. Started on Rocephin. He does have baseline dementia. Continue to monitor. CT head did not show any acute findings   2. Urinary tract infection. Currently on Rocephin. Follow-up urine culture.  3. Seizure disorder. Dilantin level is therapeutic. Continue current dosing.   4. Dementia.   DVT prophylaxis: lovenox Code Status: DNR Family Communication: no family present Disposition Plan: discharge home once improved   Consultants:     Procedures:     Antimicrobials:   Rocephin 4/5>>   Subjective: Somnolent, does not answer questions  Objective: Vitals:   08/08/16 2039 08/08/16 2300 08/09/16 0537 08/09/16 1300  BP: 102/60 101/88 (!) 103/56 (!) 99/49  Pulse:  78 71 66  Resp:  15 15 16   Temp:  99.2 F (37.3 C) 99 F (37.2 C) 98 F (36.7 C)  TempSrc:  Oral Oral Axillary  SpO2:  100% 97% 99%  Weight:  65.1 kg (143 lb 8.3 oz)    Height:        Intake/Output Summary (Last 24 hours) at 08/09/16 1831 Last data filed at 08/09/16 0900  Gross per 24 hour  Intake          1123.75 ml  Output                0 ml  Net          1123.75 ml   Filed Weights   08/08/16 1647 08/08/16 2300  Weight: 68 kg (150 lb) 65.1 kg (143 lb 8.3 oz)    Examination:  General exam: Appears calm and comfortable, sleeping  Respiratory system:  Clear to auscultation. Respiratory effort normal. Cardiovascular system: S1 & S2 heard, RRR. No JVD, murmurs, rubs, gallops or clicks. No pedal edema. Gastrointestinal system: Abdomen is nondistended, soft and nontender. No organomegaly or masses felt. Normal bowel sounds heard. Central nervous system: No focal neurological deficits. Extremities: Symmetric 5 x 5 power. Skin: No rashes, lesions or ulcers Psychiatry: sleeping, difficult to arouse     Data Reviewed: I have personally reviewed following labs and imaging studies  CBC:  Recent Labs Lab 08/08/16 1700 08/09/16 0632  WBC 16.4* 18.7*  NEUTROABS 13.5*  --   HGB 13.3 13.1  HCT 39.2 39.4  MCV 91.0 91.4  PLT 175 403   Basic Metabolic Panel:  Recent Labs Lab 08/08/16 1700 08/09/16 0632  NA 135 136  K 3.3* 4.0  CL 98* 100*  CO2 29 28  GLUCOSE 116* 106*  BUN 11 10  CREATININE 0.82 0.73  CALCIUM 8.7* 8.3*   GFR: Estimated Creatinine Clearance: 72.3 mL/min (by C-G formula based on SCr of 0.73 mg/dL). Liver Function Tests:  Recent Labs Lab 08/08/16 1700 08/09/16 0632  AST 20 18  ALT 11* 10*  ALKPHOS 180* 149*  BILITOT 0.8 1.0  PROT 6.8 6.0*  ALBUMIN 3.4* 2.9*  No results for input(s): LIPASE, AMYLASE in the last 168 hours. No results for input(s): AMMONIA in the last 168 hours. Coagulation Profile: No results for input(s): INR, PROTIME in the last 168 hours. Cardiac Enzymes: No results for input(s): CKTOTAL, CKMB, CKMBINDEX, TROPONINI in the last 168 hours. BNP (last 3 results) No results for input(s): PROBNP in the last 8760 hours. HbA1C: No results for input(s): HGBA1C in the last 72 hours. CBG:  Recent Labs Lab 08/08/16 1709  GLUCAP 85   Lipid Profile: No results for input(s): CHOL, HDL, LDLCALC, TRIG, CHOLHDL, LDLDIRECT in the last 72 hours. Thyroid Function Tests: No results for input(s): TSH, T4TOTAL, FREET4, T3FREE, THYROIDAB in the last 72 hours. Anemia Panel: No results for input(s):  VITAMINB12, FOLATE, FERRITIN, TIBC, IRON, RETICCTPCT in the last 72 hours. Sepsis Labs: No results for input(s): PROCALCITON, LATICACIDVEN in the last 168 hours.  No results found for this or any previous visit (from the past 240 hour(s)).       Radiology Studies: Dg Chest 2 View  Result Date: 08/08/2016 CLINICAL DATA:  Altered mental status EXAM: CHEST  2 VIEW COMPARISON:  Chest radiograph 11/16/2014 FINDINGS: Unchanged elevation of the left hemidiaphragm. There is a shunt catheter with tubing running from the right neck inferiorly along the right hemithorax and beyond the field of view. There is calcification in the aortic arch. No focal airspace consolidation or pulmonary edema. No pneumothorax or pleural effusion. Unchanged calcified granuloma of the right lung base. IMPRESSION: No active cardiopulmonary disease. Unchanged calcific aortic atherosclerosis and elevation of the left hemidiaphragm. Electronically Signed   By: Ulyses Jarred M.D.   On: 08/08/2016 18:07   Ct Head Wo Contrast  Result Date: 08/08/2016 CLINICAL DATA:  Altered mental status. History of alignment brain tumor. Seizure disorder. EXAM: CT HEAD WITHOUT CONTRAST TECHNIQUE: Contiguous axial images were obtained from the base of the skull through the vertex without intravenous contrast. COMPARISON:  11/16/2014.  03/25/2014. FINDINGS: Brain: There is no evidence for acute hemorrhage, hydrocephalus, mass lesion, or abnormal extra-axial fluid collection. No definite CT evidence for acute infarction. Diffuse loss of parenchymal volume is consistent with atrophy. Patchy low attenuation in the deep hemispheric and periventricular white matter is nonspecific, but likely reflects chronic microvascular ischemic demyelination. Right frontal ventriculostomy shunt catheter is stable with the tip position across the midline, unchanged. Calcification in the region of the left thalamus posteriorly is stable. Vascular: No hyperdense vessel or  unexpected calcification. Skull: Normal. Negative for fracture or focal lesion. Sinuses/Orbits: No acute finding. Other: None. IMPRESSION: 1. Stable exam.  No new or progressive interval findings. 2. Atrophy with chronic small vessel white matter ischemic disease. 3. Dystrophic calcification in the region of the posterior left thalamus, unchanged. 4. Stable position of the right frontal ventriculostomy shunt catheter. Electronically Signed   By: Misty Stanley M.D.   On: 08/08/2016 18:33        Scheduled Meds: . cefTRIAXone (ROCEPHIN)  IV  1 g Intravenous Q24H  . enoxaparin (LOVENOX) injection  40 mg Subcutaneous Q24H  . feeding supplement  1 Container Oral TID BM  . feeding supplement (PRO-STAT SUGAR FREE 64)  30 mL Oral TID BM  . phenytoin  200 mg Oral Daily  . phenytoin  300 mg Oral QHS   Continuous Infusions: . sodium chloride 75 mL/hr at 08/09/16 1110     LOS: 0 days    Time spent: 53mins    Naidelin Gugliotta, MD [facility] Pager [phone]  If 7PM-7AM, please  contact night-coverage [email or web address] Password [facility] 08/09/2016, 6:31 PM

## 2016-08-09 NOTE — Plan of Care (Signed)
Problem: Safety: Goal: Ability to remain free from injury will improve Outcome: Not Progressing Pt educated on importance for safety. Educated on bed alarm and call bell within reach. Pt needs reinforcement on education d/t lack of understanding.

## 2016-08-09 NOTE — Care Management Note (Signed)
Case Management Note  Patient Details  Name: Jacob Wagner MRN: [id number] Date of Birth: 04-16-1941  Subjective/Objective:  Adm with UTI, from home with Family. Patient non responsive and has mitts on hands. Family at bedside offering information. Family reports ind with ADL's PTA. States patient does have a RW, BSC, and WC PTA but wasn't using these currently. Patient has PCP, Dr. Gerarda Fraction, family drives him to appointments. Family reports no issues affording medications. Declines need for Home health.               Action/Plan: Anticipate DC home with self care/family. Family declines need for Home health. Family told to let CM know if they change their mind during hospital stay.    Expected Discharge Date:       08/10/2016           Expected Discharge Plan:  Home/Self Care  In-House Referral:     Discharge planning Services  CM Consult  Post Acute Care Choice:    Choice offered to:     DME Arranged:    DME Agency:     HH Arranged:    HH Agency:     Status of Service:  In process, will continue to follow  If discussed at Long Length of Stay Meetings, dates discussed:    Additional Comments:  Kamuela Magos, Chauncey [place], RN 08/09/2016, 1:17 PM

## 2016-08-09 NOTE — Care Management Obs Status (Signed)
[facility] NOTIFICATION   Patient Details  Name: IZAN MIRON MRN: [id number] Date of Birth: Jun 02, 1940   Medicare Observation Status Notification Given:  Yes    Akiel Fennell, Chauncey [place], RN 08/09/2016, 11:13 AM

## 2016-08-10 LAB — CBC
HCT: 37.4 % — ABNORMAL LOW (ref 39.0–52.0)
Hemoglobin: 12.7 g/dL — ABNORMAL LOW (ref 13.0–17.0)
MCH: 30.6 pg (ref 26.0–34.0)
MCHC: 34 g/dL (ref 30.0–36.0)
MCV: 90.1 fL (ref 78.0–100.0)
Platelets: 157 10*3/uL (ref 150–400)
RBC: 4.15 MIL/uL — AB (ref 4.22–5.81)
RDW: 13.4 % (ref 11.5–15.5)
WBC: 17.8 10*3/uL — AB (ref 4.0–10.5)

## 2016-08-10 LAB — BASIC METABOLIC PANEL
ANION GAP: 7 (ref 5–15)
BUN: 9 mg/dL (ref 6–20)
CALCIUM: 8.1 mg/dL — AB (ref 8.9–10.3)
CO2: 28 mmol/L (ref 22–32)
Chloride: 102 mmol/L (ref 101–111)
Creatinine, Ser: 0.64 mg/dL (ref 0.61–1.24)
GLUCOSE: 126 mg/dL — AB (ref 65–99)
Potassium: 3.3 mmol/L — ABNORMAL LOW (ref 3.5–5.1)
SODIUM: 137 mmol/L (ref 135–145)

## 2016-08-10 MED ORDER — DIPHENOXYLATE-ATROPINE 2.5-0.025 MG PO TABS
1.0000 | ORAL_TABLET | Freq: Every day | ORAL | Status: DC
  Administered 2016-08-10 – 2016-08-11 (×2): 1 via ORAL
  Filled 2016-08-10 (×2): qty 1

## 2016-08-10 MED ORDER — POTASSIUM CHLORIDE CRYS ER 20 MEQ PO TBCR
40.0000 meq | EXTENDED_RELEASE_TABLET | Freq: Once | ORAL | Status: AC
  Administered 2016-08-10: 40 meq via ORAL
  Filled 2016-08-10: qty 2

## 2016-08-10 MED ORDER — DIPHENOXYLATE-ATROPINE 2.5-0.025 MG PO TABS: 1.0000 | ORAL_TABLET | Freq: Every day | ORAL | Status: DC

## 2016-08-10 NOTE — Progress Notes (Signed)
PROGRESS NOTE    Jacob Wagner  QQP:[id number] DOB: [birth year] DOA: 08/08/2016 PCP: Glo Herring, MD   Brief Narrative:  4 rolled male with a history of seizure disorder, dementia, prior stroke who was brought to the hospital with change in his mental status. He was unhappy in her tract infection and started on intravenous antibiotics. Anticipate discharge home the next 24 hours if mental status begins to return to baseline.   Assessment & Plan:   Active Problems:   Seizure disorder (HCC)   Dementia   VP (ventriculoperitoneal) shunt status   UTI (urinary tract infection)   1. Acute encephalopathy . related to urinary tract infection. Started on Rocephin. He does have baseline dementia. Continue to monitor. CT head did not show any acute findings. Family feels he is not quite returned to baseline. He does appear to be improving. Mental status appears to wax and wane with lethargy and alertness.   2. Urinary tract infection. Currently on Rocephin. Still has significant leukocytosis. Follow-up urine culture and continue current treatments.  3. Seizure disorder. Dilantin level is therapeutic. Continue current dosing.   4. Dementia.   DVT prophylaxis: lovenox Code Status: DNR Family Communication: no family present Disposition Plan: discharge home once improved   Consultants:     Procedures:     Antimicrobials:   Rocephin 4/5>>   Subjective: Confused, pleasant, family feels he is not back to baseline yet  Objective: Vitals:   08/09/16 1300 08/09/16 2206 08/10/16 0614 08/10/16 1300  BP: (!) 99/49 (!) 136/51 109/63 (!) 99/59  Pulse: 66 70 67 62  Resp: 16 18 18 18   Temp: 98 F (36.7 C) 98 F (36.7 C) 98 F (36.7 C) 98.6 F (37 C)  TempSrc: Axillary Axillary Axillary Axillary  SpO2: 99% 99% 100% 99%  Weight:      Height:        Intake/Output Summary (Last 24 hours) at 08/10/16 1744 Last data filed at 08/10/16 1320  Gross per 24 hour  Intake               240 ml  Output              350 ml  Net             -110 ml   Filed Weights   08/08/16 1647 08/08/16 2300  Weight: 68 kg (150 lb) 65.1 kg (143 lb 8.3 oz)    Examination:  General exam: Appears calm and comfortable Respiratory system: Clear to auscultation. Respiratory effort normal. Cardiovascular system: S1 & S2 heard, RRR. No JVD, murmurs, rubs, gallops or clicks. No pedal edema. Gastrointestinal system: Abdomen is nondistended, soft and nontender. No organomegaly or masses felt. Normal bowel sounds heard. Central nervous system: No focal neurological deficits. Extremities: Symmetric 5 x 5 power. Skin: No rashes, lesions or ulcers Psychiatry: confused, pleasant     Data Reviewed: I have personally reviewed following labs and imaging studies  CBC:  Recent Labs Lab 08/08/16 1700 08/09/16 0632 08/10/16 0618  WBC 16.4* 18.7* 17.8*  NEUTROABS 13.5*  --   --   HGB 13.3 13.1 12.7*  HCT 39.2 39.4 37.4*  MCV 91.0 91.4 90.1  PLT 175 150 712   Basic Metabolic Panel:  Recent Labs Lab 08/08/16 1700 08/09/16 0632 08/10/16 0618  NA 135 136 137  K 3.3* 4.0 3.3*  CL 98* 100* 102  CO2 29 28 28   GLUCOSE 116* 106* 126*  BUN 11 10 9   CREATININE 0.82  0.73 0.64  CALCIUM 8.7* 8.3* 8.1*   GFR: Estimated Creatinine Clearance: 72.3 mL/min (by C-G formula based on SCr of 0.64 mg/dL). Liver Function Tests:  Recent Labs Lab 08/08/16 1700 08/09/16 0632  AST 20 18  ALT 11* 10*  ALKPHOS 180* 149*  BILITOT 0.8 1.0  PROT 6.8 6.0*  ALBUMIN 3.4* 2.9*   No results for input(s): LIPASE, AMYLASE in the last 168 hours. No results for input(s): AMMONIA in the last 168 hours. Coagulation Profile: No results for input(s): INR, PROTIME in the last 168 hours. Cardiac Enzymes: No results for input(s): CKTOTAL, CKMB, CKMBINDEX, TROPONINI in the last 168 hours. BNP (last 3 results) No results for input(s): PROBNP in the last 8760 hours. HbA1C: No results for input(s): HGBA1C in  the last 72 hours. CBG:  Recent Labs Lab 08/08/16 1709  GLUCAP 85   Lipid Profile: No results for input(s): CHOL, HDL, LDLCALC, TRIG, CHOLHDL, LDLDIRECT in the last 72 hours. Thyroid Function Tests: No results for input(s): TSH, T4TOTAL, FREET4, T3FREE, THYROIDAB in the last 72 hours. Anemia Panel: No results for input(s): VITAMINB12, FOLATE, FERRITIN, TIBC, IRON, RETICCTPCT in the last 72 hours. Sepsis Labs: No results for input(s): PROCALCITON, LATICACIDVEN in the last 168 hours.  Recent Results (from the past 240 hour(s))  Urine culture     Status: Abnormal (Preliminary result)   Collection Time: 08/08/16  5:00 PM  Result Value Ref Range Status   Specimen Description URINE, RANDOM  Final   Special Requests NONE  Final   Culture (A)  Final    >=100,000 COLONIES/mL KLEBSIELLA PNEUMONIAE SUSCEPTIBILITIES TO FOLLOW Performed at [facility], [address] [place], [place] [address]    Report Status PENDING  Incomplete         Radiology Studies: Dg Chest 2 View  Result Date: 08/08/2016 CLINICAL DATA:  Altered mental status EXAM: CHEST  2 VIEW COMPARISON:  Chest radiograph 11/16/2014 FINDINGS: Unchanged elevation of the left hemidiaphragm. There is a shunt catheter with tubing running from the right neck inferiorly along the right hemithorax and beyond the field of view. There is calcification in the aortic arch. No focal airspace consolidation or pulmonary edema. No pneumothorax or pleural effusion. Unchanged calcified granuloma of the right lung base. IMPRESSION: No active cardiopulmonary disease. Unchanged calcific aortic atherosclerosis and elevation of the left hemidiaphragm. Electronically Signed   By: Ulyses Jarred M.D.   On: 08/08/2016 18:07   Ct Head Wo Contrast  Result Date: 08/08/2016 CLINICAL DATA:  Altered mental status. History of alignment brain tumor. Seizure disorder. EXAM: CT HEAD WITHOUT CONTRAST TECHNIQUE: Contiguous axial images were obtained from the  base of the skull through the vertex without intravenous contrast. COMPARISON:  11/16/2014.  03/25/2014. FINDINGS: Brain: There is no evidence for acute hemorrhage, hydrocephalus, mass lesion, or abnormal extra-axial fluid collection. No definite CT evidence for acute infarction. Diffuse loss of parenchymal volume is consistent with atrophy. Patchy low attenuation in the deep hemispheric and periventricular white matter is nonspecific, but likely reflects chronic microvascular ischemic demyelination. Right frontal ventriculostomy shunt catheter is stable with the tip position across the midline, unchanged. Calcification in the region of the left thalamus posteriorly is stable. Vascular: No hyperdense vessel or unexpected calcification. Skull: Normal. Negative for fracture or focal lesion. Sinuses/Orbits: No acute finding. Other: None. IMPRESSION: 1. Stable exam.  No new or progressive interval findings. 2. Atrophy with chronic small vessel white matter ischemic disease. 3. Dystrophic calcification in the region of the posterior left  thalamus, unchanged. 4. Stable position of the right frontal ventriculostomy shunt catheter. Electronically Signed   By: Misty Stanley M.D.   On: 08/08/2016 18:33        Scheduled Meds: . cefTRIAXone (ROCEPHIN)  IV  1 g Intravenous Q24H  . enoxaparin (LOVENOX) injection  40 mg Subcutaneous Q24H  . feeding supplement  1 Container Oral TID BM  . feeding supplement (PRO-STAT SUGAR FREE 64)  30 mL Oral TID BM  . phenytoin  200 mg Oral Daily  . phenytoin  300 mg Oral QHS  . potassium chloride  40 mEq Oral Once   Continuous Infusions: . sodium chloride 75 mL/hr at 08/10/16 1401     LOS: 1 day    Time spent: 21mins    MEMON,JEHANZEB, MD [facility] Pager [phone]  If 7PM-7AM, please contact night-coverage [email or web address] Password [facility] 08/10/2016, 5:44 PM

## 2016-08-11 DIAGNOSIS — Z23 Encounter for immunization: Secondary | ICD-10-CM | POA: Diagnosis not present

## 2016-08-11 LAB — CBC
HEMATOCRIT: 36.9 % — AB (ref 39.0–52.0)
HEMOGLOBIN: 12.6 g/dL — AB (ref 13.0–17.0)
MCH: 30.9 pg (ref 26.0–34.0)
MCHC: 34.1 g/dL (ref 30.0–36.0)
MCV: 90.4 fL (ref 78.0–100.0)
Platelets: 150 10*3/uL (ref 150–400)
RBC: 4.08 MIL/uL — ABNORMAL LOW (ref 4.22–5.81)
RDW: 13.3 % (ref 11.5–15.5)
WBC: 9.7 10*3/uL (ref 4.0–10.5)

## 2016-08-11 LAB — BASIC METABOLIC PANEL
ANION GAP: 7 (ref 5–15)
BUN: 10 mg/dL (ref 6–20)
CHLORIDE: 103 mmol/L (ref 101–111)
CO2: 25 mmol/L (ref 22–32)
Calcium: 7.8 mg/dL — ABNORMAL LOW (ref 8.9–10.3)
Creatinine, Ser: 0.58 mg/dL — ABNORMAL LOW (ref 0.61–1.24)
GFR calc Af Amer: >60 mL/min (ref >60)
GFR calc non Af Amer: >60 mL/min (ref >60)
GLUCOSE: 92 mg/dL (ref 65–99)
POTASSIUM: 3.5 mmol/L (ref 3.5–5.1)
Sodium: 135 mmol/L (ref 135–145)

## 2016-08-11 LAB — URINE CULTURE: Culture: >=100000 — AB

## 2016-08-11 MED ORDER — SULFAMETHOXAZOLE-TRIMETHOPRIM 800-160 MG PO TABS
1.0000 | ORAL_TABLET | Freq: Two times a day (BID) | ORAL | Status: DC
  Administered 2016-08-11: 1 via ORAL
  Filled 2016-08-11 (×2): qty 1

## 2016-08-11 MED ORDER — SULFAMETHOXAZOLE-TRIMETHOPRIM 800-160 MG PO TABS: 1.0000 | ORAL_TABLET | Freq: Two times a day (BID) | ORAL | 0 refills | Status: DC

## 2016-08-11 NOTE — Discharge Summary (Signed)
Physician Discharge Summary  Jacob Wagner WJX:[id number] DOB: April 19, 1941 DOA: 08/08/2016  PCP: Glo Herring, MD  Admit date: 08/08/2016 Discharge date: 08/11/2016  Admitted From: home Disposition:  home  Recommendations for Outpatient Follow-up:  1. Follow up with PCP in 1-2 weeks 2. Please obtain BMP/CBC in one week   Discharge Condition:Stable CODE STATUS: DO NOT RESUSCITATE Diet recommendation: Heart Healthy  Brief/Interim Summary: 76 year old male with a history of seizure disorder, dementia and prior stroke who was brought to the hospital with changes in his mental status. He was noted to be less responsive. He was found to have a urinary tract infection with Proteus. He was treated appropriately with IV antibiotics, Rocephin, and transition to oral Bactrim based on culture sensitivities. His overall mental status has improved and is approaching baseline. Dilantin level was checked and was found be therapeutic. He was continued on his normal Dilantin dosing. Remainder of his medical problems remain stable and he is stable for discharge back home today.  Discharge Diagnoses:  Active Problems:   Seizure disorder (HCC)   Dementia   VP (ventriculoperitoneal) shunt status   UTI (urinary tract infection) Acute encephalopathy due to urinary tract infection   Discharge Instructions  Discharge Instructions    Diet - low sodium heart healthy    Complete by:  As directed    Increase activity slowly    Complete by:  As directed      Allergies as of 08/11/2016      Reactions   Aricept [donepezil Hcl] Diarrhea   Ciprocinonide [fluocinolone]    Flomax [tamsulosin Hcl]    Ibuprofen    Levaquin [levofloxacin In D5w]    Lipitor [atorvastatin]    dizziness   Namenda [memantine Hcl]    itch   Other    Wife states NO MRI due to shunt in pts head.       Medication List    TAKE these medications   acetaminophen 325 MG tablet Commonly known as:  TYLENOL Take 650 mg by mouth  every 6 (six) hours as needed for mild pain, moderate pain or headache.   diphenoxylate-atropine 2.5-0.025 MG tablet Commonly known as:  LOMOTIL Take 2 tablets by mouth daily. Reported on 06/27/2015   lactulose 10 GM/15ML solution Commonly known as:  CHRONULAC Take 15-30 mLs by mouth 4 (four) times daily as needed for mild constipation or moderate constipation.   phenytoin 100 MG ER capsule Commonly known as:  DILANTIN Take 1 capsule (100 mg total) by mouth 2 (two) times daily.   sulfamethoxazole-trimethoprim 800-160 MG tablet Commonly known as:  BACTRIM DS,SEPTRA DS Take 1 tablet by mouth every 12 (twelve) hours.       Allergies  Allergen Reactions  . Aricept [Donepezil Hcl] Diarrhea  . Ciprocinonide [Fluocinolone]   . Flomax [Tamsulosin Hcl]   . Ibuprofen   . Levaquin [Levofloxacin In D5w]   . Lipitor [Atorvastatin]     dizziness  . Namenda [Memantine Hcl]     itch  . Other     Wife states NO MRI due to shunt in pts head.     Consultations:     Procedures/Studies: Dg Chest 2 View  Result Date: 08/08/2016 CLINICAL DATA:  Altered mental status EXAM: CHEST  2 VIEW COMPARISON:  Chest radiograph 11/16/2014 FINDINGS: Unchanged elevation of the left hemidiaphragm. There is a shunt catheter with tubing running from the right neck inferiorly along the right hemithorax and beyond the field of view. There is calcification in the aortic  arch. No focal airspace consolidation or pulmonary edema. No pneumothorax or pleural effusion. Unchanged calcified granuloma of the right lung base. IMPRESSION: No active cardiopulmonary disease. Unchanged calcific aortic atherosclerosis and elevation of the left hemidiaphragm. Electronically Signed   By: Ulyses Jarred M.D.   On: 08/08/2016 18:07   Ct Head Wo Contrast  Result Date: 08/08/2016 CLINICAL DATA:  Altered mental status. History of alignment brain tumor. Seizure disorder. EXAM: CT HEAD WITHOUT CONTRAST TECHNIQUE: Contiguous axial images  were obtained from the base of the skull through the vertex without intravenous contrast. COMPARISON:  11/16/2014.  03/25/2014. FINDINGS: Brain: There is no evidence for acute hemorrhage, hydrocephalus, mass lesion, or abnormal extra-axial fluid collection. No definite CT evidence for acute infarction. Diffuse loss of parenchymal volume is consistent with atrophy. Patchy low attenuation in the deep hemispheric and periventricular white matter is nonspecific, but likely reflects chronic microvascular ischemic demyelination. Right frontal ventriculostomy shunt catheter is stable with the tip position across the midline, unchanged. Calcification in the region of the left thalamus posteriorly is stable. Vascular: No hyperdense vessel or unexpected calcification. Skull: Normal. Negative for fracture or focal lesion. Sinuses/Orbits: No acute finding. Other: None. IMPRESSION: 1. Stable exam.  No new or progressive interval findings. 2. Atrophy with chronic small vessel white matter ischemic disease. 3. Dystrophic calcification in the region of the posterior left thalamus, unchanged. 4. Stable position of the right frontal ventriculostomy shunt catheter. Electronically Signed   By: Misty Stanley M.D.   On: 08/08/2016 18:33       Subjective: Patient answers questions. He does not appear to be in distress. He is confused.  Discharge Exam: Vitals:   08/10/16 2140 08/11/16 0554  BP: 110/60 108/61  Pulse: 62 62  Resp: 16 16  Temp: 97.6 F (36.4 C) 97.5 F (36.4 C)   Vitals:   08/10/16 0614 08/10/16 1300 08/10/16 2140 08/11/16 0554  BP: 109/63 (!) 99/59 110/60 108/61  Pulse: 67 62 62 62  Resp: 18 18 16 16   Temp: 98 F (36.7 C) 98.6 F (37 C) 97.6 F (36.4 C) 97.5 F (36.4 C)  TempSrc: Axillary Axillary Oral Axillary  SpO2: 100% 99% 100% 100%  Weight:      Height:        General: Pt is alert, awake, not in acute distress Cardiovascular: RRR, S1/S2 +, no rubs, no gallops Respiratory: CTA  bilaterally, no wheezing, no rhonchi Abdominal: Soft, NT, ND, bowel sounds + Extremities: no edema, no cyanosis    The results of significant diagnostics from this hospitalization (including imaging, microbiology, ancillary and laboratory) are listed below for reference.     Microbiology: Recent Results (from the past 240 hour(s))  Urine culture     Status: Abnormal   Collection Time: 08/08/16  5:00 PM  Result Value Ref Range Status   Specimen Description URINE, RANDOM  Final   Special Requests NONE  Final   Culture >=100,000 COLONIES/mL KLEBSIELLA PNEUMONIAE (A)  Final   Report Status 08/11/2016 FINAL  Final   Organism ID, Bacteria KLEBSIELLA PNEUMONIAE (A)  Final      Susceptibility   Klebsiella pneumoniae - MIC*    AMPICILLIN >=32 RESISTANT Resistant     CEFAZOLIN <=4 SENSITIVE Sensitive     CEFTRIAXONE <=1 SENSITIVE Sensitive     CIPROFLOXACIN <=0.25 SENSITIVE Sensitive     GENTAMICIN <=1 SENSITIVE Sensitive     IMIPENEM <=0.25 SENSITIVE Sensitive     NITROFURANTOIN 32 SENSITIVE Sensitive     TRIMETH/SULFA <=20 SENSITIVE  Sensitive     AMPICILLIN/SULBACTAM >=32 RESISTANT Resistant     PIP/TAZO <=4 SENSITIVE Sensitive     Extended ESBL NEGATIVE Sensitive     * >=100,000 COLONIES/mL KLEBSIELLA PNEUMONIAE     Labs: BNP (last 3 results) No results for input(s): BNP in the last 8760 hours. Basic Metabolic Panel:  Recent Labs Lab 08/08/16 1700 08/09/16 0632 08/10/16 0618 08/11/16 0643  NA 135 136 137 135  K 3.3* 4.0 3.3* 3.5  CL 98* 100* 102 103  CO2 29 28 28 25   GLUCOSE 116* 106* 126* 92  BUN 11 10 9 10   CREATININE 0.82 0.73 0.64 0.58*  CALCIUM 8.7* 8.3* 8.1* 7.8*   Liver Function Tests:  Recent Labs Lab 08/08/16 1700 08/09/16 0632  AST 20 18  ALT 11* 10*  ALKPHOS 180* 149*  BILITOT 0.8 1.0  PROT 6.8 6.0*  ALBUMIN 3.4* 2.9*   No results for input(s): LIPASE, AMYLASE in the last 168 hours. No results for input(s): AMMONIA in the last 168  hours. CBC:  Recent Labs Lab 08/08/16 1700 08/09/16 0632 08/10/16 0618 08/11/16 0643  WBC 16.4* 18.7* 17.8* 9.7  NEUTROABS 13.5*  --   --   --   HGB 13.3 13.1 12.7* 12.6*  HCT 39.2 39.4 37.4* 36.9*  MCV 91.0 91.4 90.1 90.4  PLT 175 150 157 150   Cardiac Enzymes: No results for input(s): CKTOTAL, CKMB, CKMBINDEX, TROPONINI in the last 168 hours. BNP: Invalid input(s): POCBNP CBG:  Recent Labs Lab 08/08/16 1709  GLUCAP 85   D-Dimer No results for input(s): DDIMER in the last 72 hours. Hgb A1c No results for input(s): HGBA1C in the last 72 hours. Lipid Profile No results for input(s): CHOL, HDL, LDLCALC, TRIG, CHOLHDL, LDLDIRECT in the last 72 hours. Thyroid function studies No results for input(s): TSH, T4TOTAL, T3FREE, THYROIDAB in the last 72 hours.  Invalid input(s): FREET3 Anemia work up No results for input(s): VITAMINB12, FOLATE, FERRITIN, TIBC, IRON, RETICCTPCT in the last 72 hours. Urinalysis    Component Value Date/Time   COLORURINE YELLOW 08/08/2016 1700   APPEARANCEUR HAZY (A) 08/08/2016 1700   LABSPEC 1.023 08/08/2016 1700   PHURINE 5.0 08/08/2016 1700   GLUCOSEU NEGATIVE 08/08/2016 1700   HGBUR LARGE (A) 08/08/2016 1700   BILIRUBINUR NEGATIVE 08/08/2016 1700   KETONESUR NEGATIVE 08/08/2016 1700   PROTEINUR NEGATIVE 08/08/2016 1700   UROBILINOGEN 4.0 (H) 11/16/2014 1220   NITRITE POSITIVE (A) 08/08/2016 1700   LEUKOCYTESUR TRACE (A) 08/08/2016 1700   Sepsis Labs Invalid input(s): PROCALCITONIN,  WBC,  LACTICIDVEN Microbiology Recent Results (from the past 240 hour(s))  Urine culture     Status: Abnormal   Collection Time: 08/08/16  5:00 PM  Result Value Ref Range Status   Specimen Description URINE, RANDOM  Final   Special Requests NONE  Final   Culture >=100,000 COLONIES/mL KLEBSIELLA PNEUMONIAE (A)  Final   Report Status 08/11/2016 FINAL  Final   Organism ID, Bacteria KLEBSIELLA PNEUMONIAE (A)  Final      Susceptibility   Klebsiella  pneumoniae - MIC*    AMPICILLIN >=32 RESISTANT Resistant     CEFAZOLIN <=4 SENSITIVE Sensitive     CEFTRIAXONE <=1 SENSITIVE Sensitive     CIPROFLOXACIN <=0.25 SENSITIVE Sensitive     GENTAMICIN <=1 SENSITIVE Sensitive     IMIPENEM <=0.25 SENSITIVE Sensitive     NITROFURANTOIN 32 SENSITIVE Sensitive     TRIMETH/SULFA <=20 SENSITIVE Sensitive     AMPICILLIN/SULBACTAM >=32 RESISTANT Resistant  PIP/TAZO <=4 SENSITIVE Sensitive     Extended ESBL NEGATIVE Sensitive     * >=100,000 COLONIES/mL KLEBSIELLA PNEUMONIAE     Time coordinating discharge: Over 30 minutes  SIGNED:   Kathie Dike, MD  [facility] 08/11/2016, 3:16 PM Pager   If 7PM-7AM, please contact night-coverage [email or web address] Password [email or web address]

## 2016-08-11 NOTE — Progress Notes (Signed)
Patient discharged home with children.  IV removed - WNL.  Reviewed DC instructions and medications with patients daughter as patient has dementia.  Emphasized importance of completing whole dose of ABx and proper peri hygiene.  Verbalizes understanding.  Assisted off unit in NAD.

## 2016-08-19 DIAGNOSIS — N390 Urinary tract infection, site not specified: Secondary | ICD-10-CM | POA: Diagnosis not present

## 2016-08-19 DIAGNOSIS — E876 Hypokalemia: Secondary | ICD-10-CM | POA: Diagnosis not present

## 2016-08-19 DIAGNOSIS — D649 Anemia, unspecified: Secondary | ICD-10-CM | POA: Diagnosis not present

## 2016-08-19 DIAGNOSIS — Z6820 Body mass index (BMI) 20.0-20.9, adult: Secondary | ICD-10-CM | POA: Diagnosis not present

## 2016-09-03 DIAGNOSIS — N390 Urinary tract infection, site not specified: Secondary | ICD-10-CM | POA: Diagnosis not present

## 2016-09-20 DIAGNOSIS — N390 Urinary tract infection, site not specified: Secondary | ICD-10-CM | POA: Diagnosis not present

## 2016-09-25 DIAGNOSIS — N390 Urinary tract infection, site not specified: Secondary | ICD-10-CM | POA: Diagnosis not present

## 2016-09-28 DIAGNOSIS — C61 Malignant neoplasm of prostate: Secondary | ICD-10-CM | POA: Diagnosis not present

## 2016-10-04 ENCOUNTER — Ambulatory Visit: Payer: Medicare Other | Admitting: Urology

## 2016-10-14 DIAGNOSIS — I739 Peripheral vascular disease, unspecified: Secondary | ICD-10-CM | POA: Diagnosis not present

## 2016-10-18 ENCOUNTER — Ambulatory Visit (INDEPENDENT_AMBULATORY_CARE_PROVIDER_SITE_OTHER): Payer: Medicare Other | Admitting: Urology

## 2016-10-18 DIAGNOSIS — C61 Malignant neoplasm of prostate: Secondary | ICD-10-CM | POA: Diagnosis not present

## 2016-10-18 DIAGNOSIS — R3912 Poor urinary stream: Secondary | ICD-10-CM

## 2016-10-18 DIAGNOSIS — R3911 Hesitancy of micturition: Secondary | ICD-10-CM | POA: Diagnosis not present

## 2016-10-22 DIAGNOSIS — Z9181 History of falling: Secondary | ICD-10-CM | POA: Diagnosis not present

## 2016-10-22 DIAGNOSIS — R319 Hematuria, unspecified: Secondary | ICD-10-CM | POA: Diagnosis not present

## 2016-10-22 DIAGNOSIS — N390 Urinary tract infection, site not specified: Secondary | ICD-10-CM | POA: Diagnosis not present

## 2016-10-22 DIAGNOSIS — Z982 Presence of cerebrospinal fluid drainage device: Secondary | ICD-10-CM | POA: Diagnosis not present

## 2016-10-22 DIAGNOSIS — C61 Malignant neoplasm of prostate: Secondary | ICD-10-CM | POA: Diagnosis not present

## 2016-10-22 DIAGNOSIS — S0990XA Unspecified injury of head, initial encounter: Secondary | ICD-10-CM | POA: Diagnosis not present

## 2016-10-22 DIAGNOSIS — Z85038 Personal history of other malignant neoplasm of large intestine: Secondary | ICD-10-CM | POA: Diagnosis not present

## 2016-10-22 DIAGNOSIS — Z79899 Other long term (current) drug therapy: Secondary | ICD-10-CM | POA: Diagnosis not present

## 2016-10-22 DIAGNOSIS — C719 Malignant neoplasm of brain, unspecified: Secondary | ICD-10-CM | POA: Diagnosis not present

## 2016-11-22 ENCOUNTER — Ambulatory Visit: Payer: Medicare Other | Admitting: Family Medicine

## 2017-01-03 ENCOUNTER — Ambulatory Visit (INDEPENDENT_AMBULATORY_CARE_PROVIDER_SITE_OTHER): Payer: Medicare Other | Admitting: Urology

## 2017-01-03 DIAGNOSIS — N390 Urinary tract infection, site not specified: Secondary | ICD-10-CM

## 2017-01-03 DIAGNOSIS — C61 Malignant neoplasm of prostate: Secondary | ICD-10-CM

## 2017-01-03 DIAGNOSIS — R3912 Poor urinary stream: Secondary | ICD-10-CM | POA: Diagnosis not present

## 2017-02-09 ENCOUNTER — Encounter (HOSPITAL_COMMUNITY): Payer: Self-pay | Admitting: *Deleted

## 2017-02-09 ENCOUNTER — Inpatient Hospital Stay (HOSPITAL_COMMUNITY): Payer: Medicare Other

## 2017-02-09 ENCOUNTER — Inpatient Hospital Stay (HOSPITAL_COMMUNITY)
Admission: AD | Admit: 2017-02-09 | Discharge: 2017-02-14 | DRG: 481 | Disposition: A | Discharge status: Skilled Nursing Facility | Payer: Medicare Other | Source: Other Acute Inpatient Hospital | Attending: Internal Medicine | Admitting: Internal Medicine

## 2017-02-09 DIAGNOSIS — T8501XA Breakdown (mechanical) of ventricular intracranial (communicating) shunt, initial encounter: Secondary | ICD-10-CM | POA: Diagnosis present

## 2017-02-09 DIAGNOSIS — G919 Hydrocephalus, unspecified: Secondary | ICD-10-CM | POA: Diagnosis present

## 2017-02-09 DIAGNOSIS — C189 Malignant neoplasm of colon, unspecified: Secondary | ICD-10-CM | POA: Diagnosis not present

## 2017-02-09 DIAGNOSIS — N390 Urinary tract infection, site not specified: Secondary | ICD-10-CM

## 2017-02-09 DIAGNOSIS — S72141A Displaced intertrochanteric fracture of right femur, initial encounter for closed fracture: Secondary | ICD-10-CM | POA: Diagnosis not present

## 2017-02-09 DIAGNOSIS — M79604 Pain in right leg: Secondary | ICD-10-CM | POA: Diagnosis present

## 2017-02-09 DIAGNOSIS — F0390 Unspecified dementia without behavioral disturbance: Secondary | ICD-10-CM | POA: Diagnosis present

## 2017-02-09 DIAGNOSIS — Z87891 Personal history of nicotine dependence: Secondary | ICD-10-CM

## 2017-02-09 DIAGNOSIS — W19XXXA Unspecified fall, initial encounter: Secondary | ICD-10-CM | POA: Diagnosis present

## 2017-02-09 DIAGNOSIS — Z8673 Personal history of transient ischemic attack (TIA), and cerebral infarction without residual deficits: Secondary | ICD-10-CM | POA: Diagnosis not present

## 2017-02-09 DIAGNOSIS — G40909 Epilepsy, unspecified, not intractable, without status epilepticus: Secondary | ICD-10-CM

## 2017-02-09 DIAGNOSIS — Z982 Presence of cerebrospinal fluid drainage device: Secondary | ICD-10-CM

## 2017-02-09 DIAGNOSIS — Y838 Other surgical procedures as the cause of abnormal reaction of the patient, or of later complication, without mention of misadventure at the time of the procedure: Secondary | ICD-10-CM

## 2017-02-09 DIAGNOSIS — Z79899 Other long term (current) drug therapy: Secondary | ICD-10-CM | POA: Diagnosis not present

## 2017-02-09 DIAGNOSIS — Z888 Allergy status to other drugs, medicaments and biological substances status: Secondary | ICD-10-CM

## 2017-02-09 DIAGNOSIS — Z66 Do not resuscitate: Secondary | ICD-10-CM | POA: Diagnosis present

## 2017-02-09 DIAGNOSIS — Z85038 Personal history of other malignant neoplasm of large intestine: Secondary | ICD-10-CM

## 2017-02-09 DIAGNOSIS — Z8546 Personal history of malignant neoplasm of prostate: Secondary | ICD-10-CM

## 2017-02-09 DIAGNOSIS — Z681 Body mass index (BMI) 19 or less, adult: Secondary | ICD-10-CM | POA: Diagnosis not present

## 2017-02-09 DIAGNOSIS — Z515 Encounter for palliative care: Secondary | ICD-10-CM

## 2017-02-09 DIAGNOSIS — G4700 Insomnia, unspecified: Secondary | ICD-10-CM | POA: Diagnosis present

## 2017-02-09 DIAGNOSIS — Z7189 Other specified counseling: Secondary | ICD-10-CM

## 2017-02-09 DIAGNOSIS — S72001A Fracture of unspecified part of neck of right femur, initial encounter for closed fracture: Secondary | ICD-10-CM

## 2017-02-09 DIAGNOSIS — R636 Underweight: Secondary | ICD-10-CM | POA: Diagnosis present

## 2017-02-09 DIAGNOSIS — S72009A Fracture of unspecified part of neck of unspecified femur, initial encounter for closed fracture: Secondary | ICD-10-CM

## 2017-02-09 MED ORDER — SODIUM CHLORIDE 0.9 % IV SOLN
INTRAVENOUS | Status: DC
  Administered 2017-02-09 – 2017-02-13 (×5): via INTRAVENOUS

## 2017-02-09 MED ORDER — LACTULOSE 10 GM/15ML PO SOLN
10.0000 g | Freq: Four times a day (QID) | ORAL | Status: DC | PRN
  Administered 2017-02-13: 10:00:00 20 g via ORAL
  Filled 2017-02-09: qty 30

## 2017-02-09 MED ORDER — PHENYTOIN SODIUM EXTENDED 100 MG PO CAPS
100.0000 mg | ORAL_CAPSULE | Freq: Two times a day (BID) | ORAL | Status: DC
  Administered 2017-02-09 – 2017-02-14 (×9): 100 mg via ORAL
  Filled 2017-02-09 (×11): qty 1

## 2017-02-09 MED ORDER — ACETAMINOPHEN 325 MG PO TABS: 650.0000 mg | ORAL_TABLET | Freq: Four times a day (QID) | ORAL | Status: DC | PRN

## 2017-02-09 MED ORDER — MORPHINE SULFATE (PF) 4 MG/ML IV SOLN
0.5000 mg | INTRAVENOUS | Status: DC | PRN
  Administered 2017-02-09 (×4): 0.52 mg via INTRAVENOUS
  Filled 2017-02-09 (×4): qty 1

## 2017-02-09 MED ORDER — DEXTROSE 5 % IV SOLN
1.0000 g | INTRAVENOUS | Status: DC
  Administered 2017-02-09 – 2017-02-11 (×3): 1 g via INTRAVENOUS
  Filled 2017-02-09 (×4): qty 10

## 2017-02-09 MED ORDER — DEXTROSE 5 % IV SOLN: 1.0000 g | INTRAVENOUS | Status: DC

## 2017-02-09 MED ORDER — HYDROCODONE-ACETAMINOPHEN 5-325 MG PO TABS
1.0000 | ORAL_TABLET | Freq: Four times a day (QID) | ORAL | Status: DC | PRN
  Administered 2017-02-09: 2 via ORAL
  Administered 2017-02-12 (×2): 1 via ORAL
  Filled 2017-02-09: qty 2
  Filled 2017-02-09 (×2): qty 1

## 2017-02-09 NOTE — Progress Notes (Signed)
Received call from Dr Bonner Puna regarding patient. Patient was admitted for displaced, angulated intertrochanteric right hip fracture. Dr Alvan Dame (Ortho) has rec ORIF which per Palliative care note, wife has agreed to proceed. Neurosurgery contacted due to reported increased dilation of ventricles since CT head April 2018. Patient is s/p VP shunt placement by Dr [name] several years ago. Unfortunately, unable to see this recent CT scan which would be helpful for [place] to add any benefit. Regardless, it sounds like this was found incidentally when the CT was done due to the fall. Based on age and advanced dementia, do not believe even if there was a shunt malfunction that it would provide any improvement in quality of life for correction. If the wife would like to even consider moving forward with any type of NS intervention, he can be scheduled outpt for follow up. Case with reviewed with attending Dr Cyndy Freeze.

## 2017-02-09 NOTE — Consult Note (Signed)
Reason for Consult: Right hip fracture Referring Physician: Bonner Puna, MD   Jacob Wagner is an 76 y.o. male.  HPI: Jacob Wagner is a 76 y.o. 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  Patient presented to the ED at [facility] last night for evaluation of R hip pain.  Patient has advanced dementia, and cant really contribute much to history, although based on notes from his UTI admit to our AP service in April this year, this is baseline.  According to history from [facility] obtained from patients daughter: Patient found laying on floor sleeping earlier in day Friday.  Family didn't think much of this initially as he has a tendency to lay down on floor to sleep at times (not fall).  Patient moved to couch by family members but hasnt wanted to walk since then, which is unusual.  Past Medical History:  Diagnosis Date  . Abnormality of gait 09/20/2014  . 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 Colon cancer ([place])   . Dementia   . Dementia   . Hypercholesteremia   . Insomnia   . Memory difficulties 09/20/2014  . Prostate cancer ([place])   . Seizures ([place])   . Stroke [facility]    left temporal    Past Surgical History:  Procedure Laterality Date  . Colon surgery 10 yrs ago    . FOOT SURGERY     left ankle ORIF/ fracture  . INGUINAL HERNIA REPAIR Right   . Prostate cancer    . shunt to head      Family History  Problem Relation Age of Onset  . Cancer Father        lung  . Benign prostatic hyperplasia Father   . Dementia Mother   . Hypertension Mother   . Hyperlipidemia Mother   . Stomach cancer Maternal Uncle   . Bone cancer Maternal Uncle     Social History:  reports that he quit smoking about 7 years ago. He has never used smokeless tobacco. He reports that he does not drink alcohol or use drugs.  Allergies:  Allergies   Allergen Reactions  . Aricept [Donepezil Hcl] Diarrhea  . Ciprofloxacin Itching  . Flomax [Tamsulosin Hcl] Other (See Comments)    Per daughter, reaction unknown  . Ibuprofen Other (See Comments)    Per daughter, reaction unknown  . Levaquin [Levofloxacin In D5w] Other (See Comments)    Per daughter, reaction unknown  . Lipitor [Atorvastatin] Other (See Comments)    dizziness  . Namenda [Memantine Hcl] Itching    itch  . Other Other (See Comments)    Wife states NO MRI due to shunt in pts head.     Medications:  I have reviewed the patient's current medications. Scheduled: . phenytoin  100 mg Oral BID    No results found for this or any previous visit (from the past 24 hour(s)).   X-ray: Jacob Wagner is a 76 y.o. 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  Patient presented to the ED at [facility] last night for evaluation of R hip pain.  Patient has advanced dementia, and cant really contribute much to history, although based on notes from his UTI admit to our AP  service in April this year, this is baseline.  According to history from [facility] obtained from patients daughter: Patient found laying on floor sleeping earlier in day Friday.  Family didn't think much of this initially as he has a tendency to lay down on floor to sleep at times (not fall).  Patient moved to couch by family members but hasnt wanted to walk since then, which is unusual.  ROS  Demented Per HPI  Blood pressure 132/73, pulse 73, temperature 98 F (36.7 C), temperature source Oral, resp. rate 18, SpO2 94 %.  Physical Exam  In hospital bed with padding on railings Pain with any movement of right LE Shortened  No obvious deformity to UEs or LLE  Assessment/Plan: Displaced angulated right intertrochanteric hip fracture transferred from  [facility]: would recommend ORIF of right hip for pain control BUT reports from nursing as family not present and unable to reach this am are that they are not interested in pursuing surgery at this time  If this is the case then medicine may wish to consult palliative care  If family is reached and surgery discussion is appropriate please contact me Alvan Dame - [phone]  Otherwise NWB RLE  Mauri Pole 02/09/2017, 8:20 AM

## 2017-02-09 NOTE — H&P (Addendum)
History and Physical    Jacob Wagner GDJ:[id number] DOB: 09-11-1940 DOA: 02/09/2017  PCP: Redmond School, MD  Patient coming from: Home  I have personally briefly reviewed patient's old medical records in [place]  Chief Complaint: R leg pain  HPI: Jacob Wagner is a 76 y.o. 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  Patient presented to the ED at [facility] last night for evaluation of R hip pain.  Patient has advanced dementia, and cant really contribute much to history, although based on notes from his UTI admit to our AP service in April this year, this is baseline.  According to history from [facility] obtained from patients daughter: Patient found laying on floor sleeping earlier in day Friday.  Family didn't think much of this initially as he has a tendency to lay down on floor to sleep at times (not fall).  Patient moved to couch by family members but hasnt wanted to walk since then, which is unusual.   ED Course: R hip intertrochanteric fx.  Dr. Alvan Dame requested patient be admitted to [facility].  UA shows UTI, got 1gm rocephin in ED.  After I spoke with EDP, he was supposed to call me back if CT head/neck showed abnormality.  It showed no acute traumatic issues, but did show worsening ventricle dilation compared to earlier this year possibly suggesting shunt malfunction.  Didn't call me back though.   Review of Systems: As per HPI otherwise 10 point review of systems negative.   Past Medical History:  Diagnosis Date  . Abnormality of gait 09/20/2014  . Brain tumor ([place])    thalamus  . Colon cancer ([place])   . Dementia   . Dementia   . Hypercholesteremia   . Insomnia   . Memory difficulties 09/20/2014  . Prostate cancer ([place])   . Seizures ([place])   . 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 Colon surgery 10 yrs ago    . FOOT SURGERY     left ankle ORIF/ fracture  . INGUINAL HERNIA REPAIR Right   . Prostate cancer    . shunt to head       reports that he quit smoking about 7 years ago. He has never used smokeless tobacco. He reports that he does not drink alcohol or use drugs.  Allergies  Allergen Reactions  . Aricept [Donepezil Hcl] Diarrhea  . Ciprocinonide [Fluocinolone]   . Flomax [Tamsulosin Hcl]   . Ibuprofen   . Levaquin [Levofloxacin In D5w]   . Lipitor [Atorvastatin]     dizziness  . Namenda [Memantine Hcl]     itch  . Other     Wife states NO MRI due to shunt in pts head.     Family History  Problem Relation Age of Onset  . Cancer Father        lung  . Benign prostatic hyperplasia Father   . Dementia Mother   . Hypertension Mother   . Hyperlipidemia Mother   . Stomach cancer Maternal Uncle   . Bone cancer Maternal Uncle      Prior to Admission medications   Medication Sig Start Date End Date Taking? Authorizing Provider  acetaminophen (TYLENOL) 325 MG tablet Take 650 mg by mouth every 6 (six) hours as needed for mild  pain, moderate pain or headache.     [provider]  diphenoxylate-atropine (LOMOTIL) 2.5-0.025 MG per tablet Take 2 tablets by mouth daily. Reported on 06/27/2015    [provider]  lactulose (CHRONULAC) 10 GM/15ML solution Take 15-30 mLs by mouth 4 (four) times daily as needed for mild constipation or moderate constipation.  11/04/14   [provider]  phenytoin (DILANTIN) 100 MG ER capsule Take 1 capsule (100 mg total) by mouth 2 (two) times daily. 10/26/14   Kathrynn Ducking, MD    Physical Exam: There were no vitals filed for this visit.  Constitutional: NAD, calm, comfortable Eyes: PERRL, lids and conjunctivae normal ENMT: Mucous membranes are moist. Posterior pharynx clear of any exudate or lesions.Normal dentition.  Neck: normal, supple, no masses, no  thyromegaly Respiratory: clear to auscultation bilaterally, no wheezing, no crackles. Normal respiratory effort. No accessory muscle use.  Cardiovascular: Regular rate and rhythm, no murmurs / rubs / gallops. No extremity edema. 2+ pedal pulses. No carotid bruits.  Abdomen: no tenderness, no masses palpated. No hepatosplenomegaly. Bowel sounds positive.  Musculoskeletal: R hip deformity noted, DP pulse palpable on R foot Skin: no rashes, lesions, ulcers. No induration Neurologic: CN 2-12 grossly intact. Sensation intact, DTR normal. Strength 5/5 in all 4.  Psychiatric: Normal judgment and insight. Alert and oriented x 3. Normal mood.    Labs on Admission: I have personally reviewed following labs and imaging studies  CBC: No results for input(s): WBC, NEUTROABS, HGB, HCT, MCV, PLT in the last 168 hours. Basic Metabolic Panel: No results for input(s): NA, K, CL, CO2, GLUCOSE, BUN, CREATININE, CALCIUM, MG, PHOS in the last 168 hours. GFR: CrCl cannot be calculated (Patient's most recent lab result is older than the maximum 21 days allowed.). Liver Function Tests: No results for input(s): AST, ALT, ALKPHOS, BILITOT, PROT, ALBUMIN in the last 168 hours. No results for input(s): LIPASE, AMYLASE in the last 168 hours. No results for input(s): AMMONIA in the last 168 hours. Coagulation Profile: No results for input(s): INR, PROTIME in the last 168 hours. Cardiac Enzymes: No results for input(s): CKTOTAL, CKMB, CKMBINDEX, TROPONINI in the last 168 hours. BNP (last 3 results) No results for input(s): PROBNP in the last 8760 hours. HbA1C: No results for input(s): HGBA1C in the last 72 hours. CBG: No results for input(s): GLUCAP in the last 168 hours. Lipid Profile: No results for input(s): CHOL, HDL, LDLCALC, TRIG, CHOLHDL, LDLDIRECT in the last 72 hours. Thyroid Function Tests: No results for input(s): TSH, T4TOTAL, FREET4, T3FREE, THYROIDAB in the last 72 hours. Anemia Panel: No results  for input(s): VITAMINB12, FOLATE, FERRITIN, TIBC, IRON, RETICCTPCT in the last 72 hours. Urine analysis:    Component Value Date/Time   COLORURINE YELLOW 08/08/2016 1700   APPEARANCEUR HAZY (A) 08/08/2016 1700   LABSPEC 1.023 08/08/2016 1700   PHURINE 5.0 08/08/2016 1700   GLUCOSEU NEGATIVE 08/08/2016 1700   HGBUR LARGE (A) 08/08/2016 1700   BILIRUBINUR NEGATIVE 08/08/2016 1700   KETONESUR NEGATIVE 08/08/2016 1700   PROTEINUR NEGATIVE 08/08/2016 1700   UROBILINOGEN 4.0 (H) 11/16/2014 1220   NITRITE POSITIVE (A) 08/08/2016 1700   LEUKOCYTESUR TRACE (A) 08/08/2016 1700    Radiological Exams on Admission: No results found.  EKG: Independently reviewed.  Assessment/Plan Principal Problem:   Closed intertrochanteric fracture of right hip, initial encounter [facility]) Active Problems:   Colon cancer (HCC)   Seizure disorder (HCC)   Dementia   VP (ventriculoperitoneal) shunt status   Acute lower UTI  1. Closed inter troch fx of R hip - 1. Hip fx pathway 2. [place] to see in AM 3. However, please see below, due to patients severe chronic dementia, VP shunt status, etc, family isnt even sure if they do or dont want hip surgery at this point.  They definitely dont want brain surgery for shunt failure. 4. Pain control per pathway 5. NPO 6. IVF: NS at 75 cc/hr to prevent dehydration 2. VP shunt - now with what looks like worsening hydrocephalus compared to CT earlier this year 1. CT scan from [facility] is showing up in our PACS 2. Call NS in AM for recommendations / eval 3. Has been going down hill mental status wise over the past couple of months, family just thought that it was due to UTIs or advancing dementia. 4. After discussion with wife.  She doesn't want him to have any more brain surgery anyhow.  So may not really want to talk with NS anyhow. 3. UTI - 1. Urine culture ordered 2. Rocephin ordered 4. Dementia - 1. Advanced at baseline based on our notes from April of this year,  family history from [place] 5. Seizure disorder - 1. Continue phenytoin which he has been on for several years now. 6. Colon cancer - s/p hemicolectomy, presumably in remission  DVT prophylaxis: SCDs Code Status: DNR Family Communication: Spoke with Daughter Colette on phone (number in chart).  And then wife on phone Disposition Plan: SNF after admit Consults called: Dr. Alvan Dame to see in AM, Call pal care in AM to discuss with family if they even want hip fx repaired, or patient to be just full hospice / comfort measures at this point (they arnt sure). Admission status: Admit to inpatient   [place], [place] [facility] Pager [phone]  If 7AM-7PM, please contact day team taking care of patient [email or web address] Password [email or web address]  02/09/2017, 5:08 AM

## 2017-02-09 NOTE — Progress Notes (Signed)
Pt's wife called unit & asked re: plan for surgery. She is aware that pt should not have surgery on his brain but is concerned about pain & function issues r/t fracture. She would like to discuss with Dr Alvan Dame. Message passed on to OR staff to give to Dr Alvan Dame when he goes to operate on another pt. Calyb Mcquarrie, [employer]

## 2017-02-09 NOTE — Consult Note (Signed)
Consultation Note Date: 02/09/2017   Patient Name: Jacob Wagner  DOB: [birth year]  MRN: [id number]  Age / Sex: 76 y.o., male  PCP: Redmond School, MD Referring Physician: Patrecia Pour, MD  Reason for Consultation: Establishing goals of care  HPI/Patient Profile: 76 y.o. male admitted on 02/09/2017     Clinical Assessment and Goals of Care:  Patient is a 76 year old gentleman who lives in [place], [place] with his family, has past medical history significant for advanced dementia, thalamic brain tumor status post resection, history of VP shunt placement, seizure disorder, history of colon cancer status post right hemicolectomy. The patient was noted to have had difficulty bearing weight on right lower extremity was brought into outside hospital where imaging revealed right hip fracture, transfer to [facility] for operative management, seen by orthopedics, also noted to have ventricular enlargement on CT scan of the head obtained at outside hospital.  Palliative care consult her for pain management and additional goals of care discussions.  Patient is resting in bed. His eyes are open. He does not appear to be in any acute distress. Right lower extremity does appear to be eburnated and externally rotated and patient has severe pain even on slight movement. He is on IV morphine as well as oral pain medication regimen including Tylenol.  Call placed and discussed with wife [name]. I introduced myself and palliative care as follows: Palliative medicine is specialized medical care for people living with serious illness. It focuses on providing relief from the symptoms and stress of a serious illness. The goal is to improve quality of life for both the patient and the family.  See discussions with wife below. It was difficult to understand her over the phone. We will continue goals of care discussions  with wife as well as additional family members depending on hospitalization course and disease trajectory. Thank you for the consult.  NEXT OF KIN  wife [name]   SUMMARY OF RECOMMENDATIONS    Call placed and initially unable to reach wife, then received call back from her, Ms Ranard Harte (wife [phone]) states that she has consented to the patient undergoing ORIF.   Agree with current pain regimen.   PMT will continue to follow and help guide additional goals of care discussions and disposition planning. Thank you for the consult.   Code Status/Advance Care Planning:  DNR    Symptom Management:    continue current pain regimen.   Palliative Prophylaxis:   Bowel Regimen  Psycho-social/Spiritual:   Desire for further Chaplaincy support:yes  Additional Recommendations: Caregiving  Support/Resources  Prognosis:   Unable to determine  Discharge Planning: To Be Determined      Primary Diagnoses: Present on Admission: . Acute lower UTI . Dementia . Colon cancer ([place]) . Closed intertrochanteric fracture of right hip, initial encounter ([place])   I have reviewed the medical record, interviewed the patient and family, and examined the patient. The following aspects are pertinent.  Past Medical History:  Diagnosis Date  .  Abnormality of gait 09/20/2014  . Brain tumor ([place])    thalamus  . Colon cancer ([place])   . Dementia   . Dementia   . Hypercholesteremia   . Insomnia   . Memory difficulties 09/20/2014  . Prostate cancer ([place])   . Seizures ([place])   . 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 Marital status: Married    Spouse name: N/A  . Number of children: 4  . Years of education: 10   Social History Main Topics  . Smoking status: Former Smoker    Quit date: 05/06/2009  . Smokeless tobacco: Never Used  . Alcohol use No  . Drug use: No  . Sexual activity: Not on file   Other Topics Concern  . Not on file   Social History  Narrative   Patient does not drink caffeine.   Patient is left handed.   Family History  Problem Relation Age of Onset  . Cancer Father        lung  . Benign prostatic hyperplasia Father   . Dementia Mother   . Hypertension Mother   . Hyperlipidemia Mother   . Stomach cancer Maternal Uncle   . Bone cancer Maternal Uncle    Scheduled Meds: . phenytoin  100 mg Oral BID   Continuous Infusions: . sodium chloride 75 mL/hr at 02/09/17 1047  . cefTRIAXone (ROCEPHIN)  IV     PRN Meds:.acetaminophen, HYDROcodone-acetaminophen, lactulose, morphine injection Medications Prior to Admission:  Prior to Admission medications   Medication Sig Start Date End Date Taking? Authorizing Provider  diphenoxylate-atropine (LOMOTIL) 2.5-0.025 MG per tablet Take 2 tablets by mouth daily. Reported on 06/27/2015   Yes [provider]  phenytoin (DILANTIN) 100 MG ER capsule Take 1 capsule (100 mg total) by mouth 2 (two) times daily. 10/26/14  Yes Kathrynn Ducking, MD   Allergies  Allergen Reactions  . Aricept [Donepezil Hcl] Diarrhea  . Ciprofloxacin Itching  . Flomax [Tamsulosin Hcl] Other (See Comments)    Per daughter, reaction unknown  . Ibuprofen Other (See Comments)    Per daughter, reaction unknown  . Levaquin [Levofloxacin In D5w] Other (See Comments)    Per daughter, reaction unknown  . Lipitor [Atorvastatin] Other (See Comments)    dizziness  . Namenda [Memantine Hcl] Itching    itch  . Other Other (See Comments)    Wife states NO MRI due to shunt in pts head.    Review of Systems Denies pain Has dementia Responds some   Physical Exam Gen: Alert, resting quietly in no distress. Denies pain  Pulm: Clear and nonlabored on room air  CV: RRR, no murmur, no JVD, no edema GI: Soft, NT, ND, +BS. No erythema or discharge.  Neuro: Alert and confused. No focal deficits. Skin: No rashes, lesions no ulcers  RLE is everted and externally rotated, pain on movement  Vital Signs:  BP 136/78 (BP Location: Right Arm)   Pulse 76   Temp 98 F (36.7 C) (Oral)   Resp 16   SpO2 95%  Pain Assessment: PAINAD POSS *See Group Information*: 1-Acceptable,Awake and alert Pain Score: 6    SpO2: SpO2: 95 % O2 Device:SpO2: 95 % O2 Flow Rate: .   IO: Intake/output summary:  Intake/Output Summary (Last 24 hours) at 02/09/17 1303 Last data filed at 02/09/17 1000  Gross per 24 hour  Intake  0 ml  Output                0 ml  Net                0 ml   PPS 20-30% LBM: Last BM Date:  (PTA) Baseline Weight:   Most recent weight:       Palliative Assessment/Data:     Time In:  11 Time Out:  12 Time Total:  60 min  Greater than 50%  of this time was spent counseling and coordinating care related to the above assessment and plan.  Signed by: Loistine Chance, MD  [phone]  Please contact Palliative Medicine Team phone at [phone] for questions and concerns.  For individual provider: See Shea Evans

## 2017-02-09 NOTE — Progress Notes (Signed)
Patient is alert and oriented to self only. Patient is unable to answer admission questions.

## 2017-02-09 NOTE — Progress Notes (Signed)
Patient's wife called questioning status of patient. Wife is questioning shunt and is repeating "Don't do it, don't do it". Nurse asked wife to explain what she does not want done. Wife replied "He has dementia, just don't do it, he don't want to be no vegetable." Nurse asked wife if she would like to talk to the doctor. Wife agreed that she wanted a call from the doctor. Nurse paged Jennette Kettle. Jennette Kettle is calling patient's wife.

## 2017-02-09 NOTE — Progress Notes (Signed)
PROGRESS NOTE  Subjective: Jacob Wagner is a 76 y.o. male with a complicated medical history including advanced dementia, thalamic brain tumor resection, VP shunt, seizure disorder and colon CA s/p right hemicolectomy who presented from home to [facility] ED 10/6 after refusing to bear weight. His family had found him laying on the floor the previous day, moved him to the couch, and he had not gotten up since that time. XR confirmed angulated, displaced right hip fracture. Orthopedics recommended transfer to [facility] for operative management.   Note, pt's advanced dementia severely limits his ability to give history, and he does not spontaneously report pain. He denies chest pain, dyspnea, fever, chills, abd pain, N/V/D. Incontinent of urine.   Objective: BP 132/73 (BP Location: Right Arm)   Pulse 73   Temp 98 F (36.7 C) (Oral)   Resp 18   SpO2 94%   Gen: Alert, resting quietly in no distress.  HEENT: Right VP shunt palpated without tenderness or erythema in course through into abdomen. No evidence of trauma. Pulm: Clear and nonlabored on room air  CV: RRR, no murmur, no JVD, no edema GI: Soft, NT, ND, +BS. No erythema or discharge.  Neuro: Alert and confused. No focal deficits. Skin: No rashes, lesions no ulcers  Assessment & Plan: Closed, displaced, angulated intertrochanteric right hip fracture: Due to unwitnessed fall.  - With significant displacement/skin tenting, suspect surgical management is necessary even if only for palliative reasons. He is at acceptable risk for this.  - Dr. Alvan Dame to discuss with wife by phone regarding risks/benefits.  - NWB RLE - DVT ppx per orthopedics  UTI: Noted on UA at [facility]. No sepsis. - Ceftriaxone started. Had bactrim-sensitive UTI in past.   Hydrocephalus: Worsening ventricular dilation compared to earlier 2018 suggestive of ?VP shunt malfunction. I can not find these images personally.  - Will discuss with neurosurgery, though wife has  clearly stated no major surgery would be considered.  - Continue AED  Advanced dementia: Chronic, stable. Anticipate decompensating disease trajectory following this event.  - I've asked for palliative care assistance with pain management and goals of care discussions.  Vance Gather, MD [facility] Pager [phone] 02/09/2017, 10:35 AM

## 2017-02-10 DIAGNOSIS — C189 Malignant neoplasm of colon, unspecified: Secondary | ICD-10-CM

## 2017-02-10 LAB — COMPREHENSIVE METABOLIC PANEL
ALT: 17 U/L (ref 17–63)
AST: 30 U/L (ref 15–41)
Albumin: 3.3 g/dL — ABNORMAL LOW (ref 3.5–5.0)
Alkaline Phosphatase: 214 U/L — ABNORMAL HIGH (ref 38–126)
Anion gap: 10 (ref 5–15)
BUN: 11 mg/dL (ref 6–20)
CHLORIDE: 98 mmol/L — AB (ref 101–111)
CO2: 29 mmol/L (ref 22–32)
CREATININE: 0.73 mg/dL (ref 0.61–1.24)
Calcium: 8.4 mg/dL — ABNORMAL LOW (ref 8.9–10.3)
GFR calc non Af Amer: >60 mL/min (ref >60)
Glucose, Bld: 91 mg/dL (ref 65–99)
Potassium: 3.9 mmol/L (ref 3.5–5.1)
SODIUM: 137 mmol/L (ref 135–145)
Total Bilirubin: 0.8 mg/dL (ref 0.3–1.2)
Total Protein: 6.4 g/dL — ABNORMAL LOW (ref 6.5–8.1)

## 2017-02-10 LAB — CBC
HCT: 30.8 % — ABNORMAL LOW (ref 39.0–52.0)
Hemoglobin: 10.4 g/dL — ABNORMAL LOW (ref 13.0–17.0)
MCH: 30.5 pg (ref 26.0–34.0)
MCHC: 33.8 g/dL (ref 30.0–36.0)
MCV: 90.3 fL (ref 78.0–100.0)
PLATELETS: 194 10*3/uL (ref 150–400)
RBC: 3.41 MIL/uL — AB (ref 4.22–5.81)
RDW: 13.3 % (ref 11.5–15.5)
WBC: 7.4 10*3/uL (ref 4.0–10.5)

## 2017-02-10 MED ORDER — CEFAZOLIN SODIUM-DEXTROSE 2-4 GM/100ML-% IV SOLN: 2.0000 g | INTRAVENOUS | Status: DC

## 2017-02-10 MED ORDER — SODIUM CHLORIDE 0.9 % IV SOLN
INTRAVENOUS | Status: DC
  Administered 2017-02-10: 13:00:00 via INTRAVENOUS

## 2017-02-10 MED ORDER — ENSURE ENLIVE PO LIQD
237.0000 mL | Freq: Three times a day (TID) | ORAL | Status: DC
  Administered 2017-02-10 – 2017-02-14 (×6): 237 mL via ORAL

## 2017-02-10 MED ORDER — POVIDONE-IODINE 10 % EX SWAB: 2.0000 "application " | Freq: Once | CUTANEOUS | Status: DC

## 2017-02-10 MED ORDER — CHLORHEXIDINE GLUCONATE 4 % EX LIQD: 60.0000 mL | Freq: Once | CUTANEOUS | Status: DC

## 2017-02-10 MED ORDER — DEXAMETHASONE SODIUM PHOSPHATE 10 MG/ML IJ SOLN: 8.0000 mg | Freq: Once | INTRAMUSCULAR | Status: DC

## 2017-02-10 NOTE — Progress Notes (Signed)
PROGRESS NOTE  Brief History: Jacob Wagner is a 76 y.o. male with a complicated medical history including advanced dementia, thalamic brain tumor resection, VP shunt, seizure disorder and colon CA s/p right hemicolectomy who presented from home to [facility] ED 10/6 after refusing to bear weight. His family had found him laying on the floor the previous day, moved him to the couch, and he had not gotten up since that time. XR confirmed angulated, displaced right hip fracture. Orthopedics recommended transfer to [facility] for operative management.   Subjective: No complaints per patient today. When specifically asked about pain he denies any leg pain. Daughter at bedside confirming that VP shunt surgery would not fit into their goals of care for him. She does agree to orthopedic procedure.   Objective: BP (!) 156/74 (BP Location: Left Leg)   Pulse 68   Temp 98.5 F (36.9 C) (Oral)   Resp 16   Ht 6' (1.829 m)   Wt 59 kg (130 lb)   SpO2 100%   BMI 17.63 kg/m   Gen: Alert, resting quietly in no distress.  HEENT: Right VP shunt palpated without tenderness or erythema in course through into abdomen. No evidence of trauma. Pulm: Clear and nonlabored on room air  CV: RRR, no murmur, no JVD, no edema GI: Soft, NT, ND, +BS. No erythema or discharge.  Neuro: Alert and confused. No focal deficits. Ext: RLE with tenderness throughout, shortened, lateral deformity. Wear hand mittens without wrist abrasions.  Assessment & Plan: Closed, displaced, angulated intertrochanteric right hip fracture: Due to unwitnessed fall.  - With significant displacement/skin tenting, suspect surgical management is necessary even if only for palliative reasons. He is at acceptable risk for this.  - Dr. Alvan Dame planning surgery 10/9. NPO p MN. - NWB RLE - DVT ppx per orthopedics - Pain control as ordered.  UTI: Noted on UA at [facility] No sepsis. - Continue ceftriaxone.  Hydrocephalus: Reportedly worsening ventricular  dilation compared to earlier 2018 suggestive of ?VP shunt malfunction.   - Discussed with neurosurgery 10/7, please see note. Wife has clearly declined any plans for operative management regardless of shunt functional status, so no further work up is planned. - Continue AED  Advanced dementia: Chronic, stable. Anticipate decompensating disease trajectory following this event.  - I appreciate palliative care assistance with pain management and goals of care discussions.  Jacob Gather, MD [facility] Pager [phone] 02/10/2017, 2:11 PM

## 2017-02-10 NOTE — Progress Notes (Signed)
Initial Nutrition Assessment  DOCUMENTATION CODES:   Underweight  INTERVENTION:  - Will order Ensure Enlive TID, each supplement provides 350 kcal and 20 grams of protein - Continue to encourage PO intakes of meals and supplements.  - RD will attempt to obtain further information at follow-up and will do nutrition-focused physical assessment at follow-up, if possible.   NUTRITION DIAGNOSIS:   Inadequate oral intake related to acute illness (recurrent UTIs, fall x2 with hip fx)) as evidenced by per patient/family report.  GOAL:   Patient will meet greater than or equal to 90% of their needs  MONITOR:   PO intake, Supplement acceptance, Weight trends, Labs  REASON FOR ASSESSMENT:   Consult Hip fracture protocol  ASSESSMENT:   76 y.o. 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  Patient presented to the ED at [facility] last night for evaluation of R hip pain.  Patient has advanced dementia, and cant really contribute much to history, although based on notes from his UTI admit to our AP service in April this year, this is baseline.  BMI indicates underweight status. Lunch tray in the room but mainly untouched. Pt's daughter, who lives in [place]., is at bedside and was on her laptop with head phones on when RD entered the room. She states that she is working and on the phone so discussion was fairly brief. She states that her two sisters live in town and a younger sister is the primary caregiver for the patient and patient's wife. She states that pt usually has a good appetite and will ask for food throughout the day, but that he has been on antibiotics for several weeks d/t UTI and that appetite and intakes have been decreased during that time (she is unable to give further detail). She reports plan for surgery tomorrow d/t L femur fx.    Unable to obtain information about weight trends recently, any difficulties with chewing or swallowing, if patient is able to self-feed, or if he is provided with any oral nutrition supplements (such as Ensure) at home.  Physical assessment deferred as daughter reports that pt has been combative throughout the day. Able to visualize some degree of muscle and fat wasting throughout upper body. Per chart review, pt has lost 13 lbs (9% body weight) in the past 6 months; this is not significant for time frame but could be significant in light of underweight BMI. Highly suspect some degree of malnutrition but do not feel comfortable identifying degree of malnutrition without additional information about weight or performing NFPE.  Medications reviewed. Labs reviewed; Cl: 98 mmol/L, Ca: 8.4 mg/dL, Alk Phos elevated.   IVF: NS @ 75 mL/hr.    Diet Order:  Diet NPO time specified Diet NPO time specified Except for: Sips with Meds Diet regular Room service appropriate? Yes; Fluid consistency: Thin  Skin:  Reviewed, no issues  Last BM:  10/5 (PTA)  Height:   Ht Readings from Last 1 Encounters:  02/09/17 6' (1.829 m)    Weight:   Wt Readings from Last 1 Encounters:  02/09/17 130 lb (59 kg)    Ideal Body Weight:  80.91 kg  BMI:  Body mass index is 17.63 kg/m.  Estimated Nutritional Needs:   Kcal:  1650-1890 (28-32 kcal/kg)  Protein:  70-83 grams (1.2-1.4 grams/kg)  Fluid:  >/= 1.8 L/day  EDUCATION NEEDS:   No  education needs identified at this time    Jarome Matin, MS, RD, LDN, [facility] Inpatient Clinical Dietitian Pager # [phone] After hours/weekend pager # [phone]

## 2017-02-10 NOTE — Progress Notes (Signed)
Patient ID: Jacob Wagner, male   DOB: [birth year], 76 y.o.   MRN: [id number]  Displaced right intertrochanteric femur fracture  I had a chance to speak to one of his daughters who is down from [place]. To help. Reviewed indications and X-rays  Plan at this point is to perform ORIF of right hip tomorrow Orders to follow including consent NPO after midnight Regular diet today

## 2017-02-10 NOTE — Progress Notes (Addendum)
LCSW is aware of consult for discharge planning Patient planning to have surgery on Tuesday 02/11/17.  LCSW will follow up with patient and recommendations from MD and therapies once completed. Will follow acutely for needs.  Lane Hacker, MSW Clinical Social Work: [employer] Coverage for :  [phone]

## 2017-02-11 ENCOUNTER — Encounter (HOSPITAL_COMMUNITY): Payer: Self-pay | Admitting: Orthopedic Surgery

## 2017-02-11 ENCOUNTER — Inpatient Hospital Stay (HOSPITAL_COMMUNITY): Payer: Medicare Other | Admitting: Anesthesiology

## 2017-02-11 ENCOUNTER — Inpatient Hospital Stay (HOSPITAL_COMMUNITY): Payer: Medicare Other

## 2017-02-11 ENCOUNTER — Encounter (HOSPITAL_COMMUNITY)
Admission: AD | Disposition: A | Discharge status: Skilled Nursing Facility | Payer: Self-pay | Source: Other Acute Inpatient Hospital | Attending: Family Medicine

## 2017-02-11 HISTORY — PX: FEMUR IM NAIL: SHX1597

## 2017-02-11 LAB — URINE CULTURE: Culture: NO GROWTH

## 2017-02-11 LAB — SURGICAL PCR SCREEN
MRSA, PCR: NEGATIVE
Staphylococcus aureus: NEGATIVE

## 2017-02-11 SURGERY — INSERTION, INTRAMEDULLARY ROD, FEMUR
Anesthesia: General | Site: Hip | Laterality: Right

## 2017-02-11 MED ORDER — FENTANYL CITRATE (PF) 100 MCG/2ML IJ SOLN
INTRAMUSCULAR | Status: AC
  Filled 2017-02-11: qty 2

## 2017-02-11 MED ORDER — ASPIRIN EC 325 MG PO TBEC
325.0000 mg | DELAYED_RELEASE_TABLET | Freq: Two times a day (BID) | ORAL | Status: DC
  Administered 2017-02-12 – 2017-02-14 (×5): 325 mg via ORAL
  Filled 2017-02-11 (×5): qty 1

## 2017-02-11 MED ORDER — ONDANSETRON HCL 4 MG/2ML IJ SOLN
INTRAMUSCULAR | Status: AC
  Filled 2017-02-11: qty 2

## 2017-02-11 MED ORDER — DEXAMETHASONE SODIUM PHOSPHATE 10 MG/ML IJ SOLN
INTRAMUSCULAR | Status: AC
  Filled 2017-02-11: qty 1

## 2017-02-11 MED ORDER — ROCURONIUM BROMIDE 50 MG/5ML IV SOSY
PREFILLED_SYRINGE | INTRAVENOUS | Status: AC
  Filled 2017-02-11: qty 5

## 2017-02-11 MED ORDER — METOCLOPRAMIDE HCL 5 MG/ML IJ SOLN: 5.0000 mg | Freq: Three times a day (TID) | INTRAMUSCULAR | Status: DC | PRN

## 2017-02-11 MED ORDER — METOCLOPRAMIDE HCL 5 MG PO TABS: 5.0000 mg | ORAL_TABLET | Freq: Three times a day (TID) | ORAL | Status: DC | PRN

## 2017-02-11 MED ORDER — LACTATED RINGERS IV SOLN
INTRAVENOUS | Status: DC
  Administered 2017-02-11 (×2): via INTRAVENOUS

## 2017-02-11 MED ORDER — CEFAZOLIN SODIUM-DEXTROSE 2-4 GM/100ML-% IV SOLN
INTRAVENOUS | Status: AC
  Filled 2017-02-11: qty 100

## 2017-02-11 MED ORDER — FENTANYL CITRATE (PF) 100 MCG/2ML IJ SOLN: INTRAMUSCULAR | Status: DC | PRN

## 2017-02-11 MED ORDER — ONDANSETRON HCL 4 MG PO TABS: 4.0000 mg | ORAL_TABLET | Freq: Four times a day (QID) | ORAL | Status: DC | PRN

## 2017-02-11 MED ORDER — LIDOCAINE 2% (20 MG/ML) 5 ML SYRINGE
INTRAMUSCULAR | Status: DC | PRN
  Administered 2017-02-11: 80 mg via INTRAVENOUS

## 2017-02-11 MED ORDER — DEXAMETHASONE SODIUM PHOSPHATE 10 MG/ML IJ SOLN
INTRAMUSCULAR | Status: DC | PRN
  Administered 2017-02-11: 10 mg via INTRAVENOUS

## 2017-02-11 MED ORDER — PHENOL 1.4 % MT LIQD: 1.0000 | OROMUCOSAL | Status: DC | PRN

## 2017-02-11 MED ORDER — ONDANSETRON HCL 4 MG/2ML IJ SOLN
INTRAMUSCULAR | Status: DC | PRN
  Administered 2017-02-11: 4 mg via INTRAVENOUS

## 2017-02-11 MED ORDER — HYDROCODONE-ACETAMINOPHEN 5-325 MG PO TABS: 1.0000 | ORAL_TABLET | Freq: Four times a day (QID) | ORAL | 0 refills | Status: AC | PRN

## 2017-02-11 MED ORDER — FERROUS SULFATE 325 (65 FE) MG PO TABS
325.0000 mg | ORAL_TABLET | Freq: Three times a day (TID) | ORAL | Status: DC
  Administered 2017-02-12 – 2017-02-14 (×3): 325 mg via ORAL
  Filled 2017-02-11 (×3): qty 1

## 2017-02-11 MED ORDER — PROPOFOL 10 MG/ML IV BOLUS
INTRAVENOUS | Status: DC | PRN
  Administered 2017-02-11: 50 mg via INTRAVENOUS

## 2017-02-11 MED ORDER — SUGAMMADEX SODIUM 200 MG/2ML IV SOLN
INTRAVENOUS | Status: DC | PRN
  Administered 2017-02-11: 200 mg via INTRAVENOUS

## 2017-02-11 MED ORDER — SUGAMMADEX SODIUM 200 MG/2ML IV SOLN
INTRAVENOUS | Status: AC
  Filled 2017-02-11: qty 2

## 2017-02-11 MED ORDER — FENTANYL CITRATE (PF) 100 MCG/2ML IJ SOLN
25.0000 ug | INTRAMUSCULAR | Status: DC | PRN
  Administered 2017-02-11 (×2): 50 ug via INTRAVENOUS

## 2017-02-11 MED ORDER — ASPIRIN 81 MG PO CHEW: 81.0000 mg | CHEWABLE_TABLET | Freq: Two times a day (BID) | ORAL | 0 refills | Status: AC

## 2017-02-11 MED ORDER — SUCCINYLCHOLINE CHLORIDE 200 MG/10ML IV SOSY
PREFILLED_SYRINGE | INTRAVENOUS | Status: DC | PRN
  Administered 2017-02-11: 80 mg via INTRAVENOUS

## 2017-02-11 MED ORDER — CEFAZOLIN SODIUM-DEXTROSE 2-4 GM/100ML-% IV SOLN: 2.0000 g | Freq: Four times a day (QID) | INTRAVENOUS | Status: DC

## 2017-02-11 MED ORDER — PHENYLEPHRINE 40 MCG/ML (10ML) SYRINGE FOR IV PUSH (FOR BLOOD PRESSURE SUPPORT)
PREFILLED_SYRINGE | INTRAVENOUS | Status: DC | PRN
  Administered 2017-02-11 (×2): 80 ug via INTRAVENOUS

## 2017-02-11 MED ORDER — ONDANSETRON HCL 4 MG/2ML IJ SOLN: 4.0000 mg | Freq: Four times a day (QID) | INTRAMUSCULAR | Status: DC | PRN

## 2017-02-11 MED ORDER — CEFAZOLIN SODIUM-DEXTROSE 2-3 GM-% IV SOLR
2.0000 g | Freq: Once | INTRAVENOUS | Status: AC
  Administered 2017-02-11: 2 g via INTRAVENOUS

## 2017-02-11 MED ORDER — ROCURONIUM BROMIDE 10 MG/ML (PF) SYRINGE
PREFILLED_SYRINGE | INTRAVENOUS | Status: DC | PRN
  Administered 2017-02-11: 30 mg via INTRAVENOUS
  Administered 2017-02-11 (×2): 20 mg via INTRAVENOUS

## 2017-02-11 MED ORDER — MENTHOL 3 MG MT LOZG: 1.0000 | LOZENGE | OROMUCOSAL | Status: DC | PRN

## 2017-02-11 MED ORDER — SUCCINYLCHOLINE CHLORIDE 200 MG/10ML IV SOSY
PREFILLED_SYRINGE | INTRAVENOUS | Status: AC
  Filled 2017-02-11: qty 10

## 2017-02-11 MED ORDER — PROPOFOL 10 MG/ML IV BOLUS
INTRAVENOUS | Status: AC
  Filled 2017-02-11: qty 20

## 2017-02-11 MED ORDER — FENTANYL CITRATE (PF) 100 MCG/2ML IJ SOLN
INTRAMUSCULAR | Status: DC | PRN
  Administered 2017-02-11 (×2): 50 ug via INTRAVENOUS

## 2017-02-11 MED ORDER — 0.9 % SODIUM CHLORIDE (POUR BTL) OPTIME
TOPICAL | Status: DC | PRN
  Administered 2017-02-11: 1000 mL

## 2017-02-11 MED ORDER — LIDOCAINE 2% (20 MG/ML) 5 ML SYRINGE
INTRAMUSCULAR | Status: AC
  Filled 2017-02-11: qty 5

## 2017-02-11 SURGICAL SUPPLY — 39 items
ADH SKN CLS APL DERMABOND .7 (GAUZE/BANDAGES/DRESSINGS) ×1
BAG SPEC THK2 15X12 ZIP CLS (MISCELLANEOUS)
BAG ZIPLOCK 12X15 (MISCELLANEOUS) IMPLANT
BIT DRILL CANN LG 4.3MM (BIT) IMPLANT
BLADE SAW SGTL 11.0X1.19X90.0M (BLADE) IMPLANT
BNDG GAUZE ELAST 4 BULKY (GAUZE/BANDAGES/DRESSINGS) ×3 IMPLANT
COVER PERINEAL POST (MISCELLANEOUS) ×3 IMPLANT
COVER SURGICAL LIGHT HANDLE (MISCELLANEOUS) ×3 IMPLANT
DERMABOND ADVANCED (GAUZE/BANDAGES/DRESSINGS) ×2
DERMABOND ADVANCED .7 DNX12 (GAUZE/BANDAGES/DRESSINGS) IMPLANT
DRAPE STERI IOBAN 125X83 (DRAPES) ×3 IMPLANT
DRILL BIT CANN LG 4.3MM (BIT) ×3
DRSG AQUACEL AG ADV 3.5X 4 (GAUZE/BANDAGES/DRESSINGS) IMPLANT
DRSG AQUACEL AG ADV 3.5X 6 (GAUZE/BANDAGES/DRESSINGS) IMPLANT
DRSG AQUACEL AG ADV 3.5X10 (GAUZE/BANDAGES/DRESSINGS) ×2 IMPLANT
DURAPREP 26ML APPLICATOR (WOUND CARE) ×3 IMPLANT
ELECT REM PT RETURN 15FT ADLT (MISCELLANEOUS) ×3 IMPLANT
GAUZE SPONGE 4X4 12PLY STRL (GAUZE/BANDAGES/DRESSINGS) IMPLANT
GLOVE BIOGEL M 7.0 STRL (GLOVE) IMPLANT
GLOVE BIOGEL PI IND STRL 7.5 (GLOVE) ×1 IMPLANT
GLOVE BIOGEL PI IND STRL 8.5 (GLOVE) IMPLANT
GLOVE BIOGEL PI INDICATOR 7.5 (GLOVE) ×2
GLOVE BIOGEL PI INDICATOR 8.5 (GLOVE)
GLOVE ECLIPSE 8.0 STRL XLNG CF (GLOVE) IMPLANT
GLOVE ORTHO TXT STRL SZ7.5 (GLOVE) ×3 IMPLANT
GOWN STRL REUS W/TWL LRG LVL3 (GOWN DISPOSABLE) ×3 IMPLANT
GOWN STRL REUS W/TWL XL LVL3 (GOWN DISPOSABLE) IMPLANT
GUIDEPIN 3.2X17.5 THRD DISP (PIN) ×2 IMPLANT
KIT BASIN OR (CUSTOM PROCEDURE TRAY) ×3 IMPLANT
MANIFOLD NEPTUNE II (INSTRUMENTS) ×3 IMPLANT
NAIL HIP FRACT 130D 11X180 (Screw) ×2 IMPLANT
PACK GENERAL/GYN (CUSTOM PROCEDURE TRAY) ×3 IMPLANT
POSITIONER SURGICAL ARM (MISCELLANEOUS) ×3 IMPLANT
SCREW BONE CORTICAL 5.0X36 (Screw) ×2 IMPLANT
SCREW LAG 10.5MMX105MM HFN (Screw) ×2 IMPLANT
SUT VIC AB 1 CT1 27 (SUTURE) ×3
SUT VIC AB 1 CT1 27XBRD ANTBC (SUTURE) ×1 IMPLANT
SUT VIC AB 2-0 CT1 27 (SUTURE) ×6
SUT VIC AB 2-0 CT1 27XBRD (SUTURE) ×2 IMPLANT

## 2017-02-11 NOTE — Progress Notes (Signed)
PROGRESS NOTE  Brief History: Jacob Wagner is a 76 y.o. male with a complicated medical history including advanced dementia, thalamic brain tumor resection, VP shunt, seizure disorder and colon CA s/p right hemicolectomy who presented from home to [facility] ED 10/6 after refusing to bear weight. His family had found him laying on the floor the previous day, moved him to the couch, and he had not gotten up since that time. XR confirmed angulated, displaced right hip fracture. Orthopedics recommended transfer to [facility] for operative management which is planned 10/9.  Subjective: No agitation, was able to eat when fed by her daughters.   Objective: BP (!) 155/65 (BP Location: Left Leg)   Pulse 72   Temp 97.8 F (36.6 C) (Axillary)   Resp (!) 72   Ht 6' (1.829 m)   Wt 59 kg (130 lb)   SpO2 100%   BMI 17.63 kg/m   Gen: Alert, resting quietly in no distress.  HEENT: Right VP shunt palpated without tenderness or erythema in course through into abdomen. No evidence of trauma. Pulm: Clear and nonlabored on room air  CV: RRR, no murmur, no JVD, no edema GI: Soft, NT, ND, +BS. No erythema or discharge.  Neuro: Withdrawn but rousable, not oriented. No focal deficits. Ext: RLE with tenderness throughout, shortened, lateral deformity. Bilateraly soft hand mittens without wrist abrasions.  Assessment & Plan: Closed, displaced, angulated intertrochanteric right hip fracture: Due to unwitnessed fall.  ECG with significant artifact but no ischemia evident. - Dr. Alvan Dame planning surgery today. - NWB RLE - DVT ppx per orthopedics - Pain control as ordered. - PT eval postoperatively. Monitor blood counts and renal function in AM.  UTI: Noted on UA at [facility]. No sepsis. Urine culture no growth. - Continue ceftriaxone x3 days.  Hydrocephalus: Reportedly worsening ventricular dilation compared to earlier 2018 suggestive of ?VP shunt malfunction.   - Discussed with neurosurgery 10/7, please see note.  Wife has clearly declined any plans for operative management regardless of shunt functional status, so no further work up is planned. - Continue AED  Advanced dementia: Chronic, stable. Anticipate decompensating disease trajectory following this event.  - I appreciate palliative care assistance with pain management and goals of care discussions.  Vance Gather, MD [facility] Pager [phone] 02/11/2017, 12:14 PM

## 2017-02-11 NOTE — Anesthesia Procedure Notes (Signed)
Procedure Name: Intubation Date/Time: 02/11/2017 11:29 AM Performed by: Carleene Cooper A Pre-anesthesia Checklist: Patient identified, Emergency Drugs available, Suction available, Patient being monitored and Timeout performed Patient Re-evaluated:Patient Re-evaluated prior to induction Oxygen Delivery Method: Circle system utilized Preoxygenation: Pre-oxygenation with 100% oxygen Induction Type: IV induction Ventilation: Oral airway inserted - appropriate to patient size and Mask ventilation without difficulty Laryngoscope Size: Mac and 4 Grade View: Grade I Tube type: Oral Tube size: 7.5 mm Number of attempts: 1 Airway Equipment and Method: Stylet Placement Confirmation: ETT inserted through vocal cords under direct vision,  positive ETCO2 and breath sounds checked- equal and bilateral Secured at: 22 cm Tube secured with: Tape Dental Injury: Teeth and Oropharynx as per pre-operative assessment

## 2017-02-11 NOTE — Clinical Social Work Note (Signed)
Clinical Social Work Assessment  Patient Details  Name: Jacob Wagner MRN: [id number] Date of Birth: [birth year]  Date of referral:  02/11/17               Reason for consult:  Facility Placement                Permission sought to share information with:  Family Supports Permission granted to share information::  Yes, Verbal Permission Granted  Name::        Agency::     Relationship::   Spouse/Daughters  Contact Information:     Housing/Transportation Living arrangements for the past 2 months:  Single Family Home Source of Information:  Patient Patient Interpreter Needed:  None Criminal Activity/Legal Involvement Pertinent to Current Situation/Hospitalization:  No - Comment as needed Significant Relationships:  Adult Children, Spouse Lives with:  Spouse, Adult Children Do you feel safe going back to the place where you live?  Yes Need for family participation in patient care:  Yes  Care giving concerns:  Patient with medical history of advanced dementia. Patient presented to ED at [facility] last night for evaluation of R hip pain.  CSW met patient and daughters at bedside. Patient has advanced demential and cannot really contribute much history. Patient daughters report his spouse is POA and provided CSW with copy of paperwork for his medical chart. Patient daughter explain she is unable to care for him due to her medical issues, MS and a stroke.   Social Worker assessment / plan:   CSW spoke with patient spouse via phone with daughter present to help explain discuss discharge plan. Patient spouse requested CSW inquire about placement at [place] facility close to patient family in [place], [place].  CSW explain faxing out process and bed availability close to d/c. Patient spouse and daughter report understanding.  Complete FL2, Fax clinical information, provided bed offers.   Plan: Assist with rehab placement if recommended by PT.   Employment status:  Disabled (Comment  on whether or not currently receiving Disability) Insurance information:  Managed Medicare PT Recommendations:  Not assessed at this time Information / Referral to community resources:     Patient/Family's Response to care:  Responding to care. Patient has planned surgery.  Patient/Family's Understanding of and Emotional Response to Diagnosis, Current Treatment, and Prognosis:  "My mother cannot care for him at home and I know he will need to go to rehab before going home. I hope he can participate and get better."  Emotional Assessment Appearance:  Appears stated age, Developmentally appropriate Attitude/Demeanor/Rapport:    Affect (typically observed):    Orientation:  Oriented to Self (Responds to Voice) Alcohol / Substance use:    Psych involvement (Current and /or in the community):  No (Comment)  Discharge Needs  Concerns to be addressed:  Discharge Planning Concerns, Care Coordination Readmission within the last 30 days:  No Current discharge risk:  None Barriers to Discharge:  Continued Medical Work up   [name], LCSW 02/11/2017, 4:18 PM

## 2017-02-11 NOTE — Anesthesia Postprocedure Evaluation (Signed)
Anesthesia Post Note  Patient: Jacob Wagner  Procedure(s) Performed: INTRAMEDULLARY (IM) NAIL FEMORAL RIGHT (Right Hip)     Patient location during evaluation: PACU Anesthesia Type: General Level of consciousness: awake and alert Pain management: pain level controlled Vital Signs Assessment: post-procedure vital signs reviewed and stable Respiratory status: spontaneous breathing, nonlabored ventilation, respiratory function stable and patient connected to nasal cannula oxygen Cardiovascular status: blood pressure returned to baseline and stable Postop Assessment: no apparent nausea or vomiting Anesthetic complications: no    Last Vitals:  Vitals:   02/11/17 1449 02/11/17 1500  BP: 133/83 122/71  Pulse:  76  Resp:  15  Temp: 36.5 C 37.1 C  SpO2: 100% 100%    Last Pain:  Vitals:   02/11/17 1500  TempSrc: Oral  PainSc:                  Caliya Narine,W. EDMOND

## 2017-02-11 NOTE — Care Management Note (Signed)
Case Management Note  Patient Details  Name: Jacob Wagner MRN: [id number] Date of Birth: [birth year]  Subjective/Objective:  76 y/o m admitted w/R hip fx. From home. IM nail R hip. CSW already following for SNF. Await PT recc.                 Action/Plan:d/c plan SNF.   Expected Discharge Date:                  Expected Discharge Plan:  Skilled Nursing Facility  In-House Referral:  Clinical Social Work  Discharge planning Services  CM Consult  Post Acute Care Choice:    Choice offered to:     DME Arranged:    DME Agency:     HH Arranged:    [place] Agency:     Status of Service:  In process, will continue to follow  If discussed at Long Length of Stay Meetings, dates discussed:    Additional Comments:  Dessa Phi, RN 02/11/2017, 5:27 PM

## 2017-02-11 NOTE — Brief Op Note (Signed)
02/09/2017 - 02/11/2017  11:32 AM  PATIENT:  Jacob Wagner  76 y.o. male  PRE-OPERATIVE DIAGNOSIS:  Closed displaced right intertrochanteric femur fracture  POST-OPERATIVE DIAGNOSIS:  Closed displaced right intertrochanteric femur fracture  PROCEDURE:  Procedure(s): INTRAMEDULLARY (IM) NAIL FEMORAL RIGHT (Right)  SURGEON:  Surgeon(s) and Role:    Paralee Cancel, MD - Primary  PHYSICIAN ASSISTANT: Nehemiah Massed, PA-C   ANESTHESIA:   general  EBL:  Total I/O In: 331.3 [I.V.:331.3] Out: 600 [Urine:600]  EBL: <150cc  BLOOD ADMINISTERED:none  DRAINS: none   LOCAL MEDICATIONS USED:  NONE  SPECIMEN:  No Specimen  DISPOSITION OF SPECIMEN:  N/A  COUNTS:  YES  TOURNIQUET:  * No tourniquets in log *  DICTATION: .Other Dictation: Dictation Number [id number]  PLAN OF CARE: Admit to inpatient   PATIENT DISPOSITION:  PACU - hemodynamically stable.   Delay start of Pharmacological VTE agent (>24hrs) due to surgical blood loss or risk of bleeding: no

## 2017-02-11 NOTE — Transfer of Care (Signed)
Immediate Anesthesia Transfer of Care Note  Patient: Jacob Wagner  Procedure(s) Performed: INTRAMEDULLARY (IM) NAIL FEMORAL RIGHT (Right Hip)  Patient Location: PACU  Anesthesia Type:General  Level of Consciousness: sedated  Airway & Oxygen Therapy: Patient Spontanous Breathing and Patient connected to face mask oxygen  Post-op Assessment: Report given to RN and Post -op Vital signs reviewed and stable  Post vital signs: Reviewed and stable  Last Vitals:  Vitals:   02/11/17 0700 02/11/17 0823  BP: (!) 165/73 (!) 155/65  Pulse: 75 72  Resp:  (!) 72  Temp: 36.8 C 36.6 C  SpO2: 100% 100%    Last Pain:  Vitals:   02/11/17 0823  TempSrc: Axillary  PainSc:          Complications: No apparent anesthesia complications

## 2017-02-11 NOTE — Anesthesia Preprocedure Evaluation (Addendum)
Anesthesia Evaluation  Patient identified by MRN, date of birth, ID band Patient awake    Reviewed: Allergy & Precautions, H&P , NPO status , Patient's Chart, lab work & pertinent test results  Airway Mallampati: II  TM Distance: >3 FB Neck ROM: Full    Dental no notable dental hx. (+) Edentulous Upper, Edentulous Lower, Dental Advisory Given   Pulmonary neg pulmonary ROS, former smoker,    Pulmonary exam normal breath sounds clear to auscultation       Cardiovascular negative cardio ROS   Rhythm:Regular Rate:Normal     Neuro/Psych Seizures -, Well Controlled,  H/o brain tumor CVA negative psych ROS   GI/Hepatic negative GI ROS, Neg liver ROS,   Endo/Other  negative endocrine ROS  Renal/GU negative Renal ROS  negative genitourinary   Musculoskeletal   Abdominal   Peds  Hematology negative hematology ROS (+)   Anesthesia Other Findings   Reproductive/Obstetrics negative OB ROS                            Anesthesia Physical Anesthesia Plan  ASA: III  Anesthesia Plan: General   Post-op Pain Management:    Induction: Intravenous  PONV Risk Score and Plan: 3 and Ondansetron, Dexamethasone and Midazolam  Airway Management Planned: Oral ETT  Additional Equipment:   Intra-op Plan:   Post-operative Plan: Extubation in OR  Informed Consent: I have reviewed the patients History and Physical, chart, labs and discussed the procedure including the risks, benefits and alternatives for the proposed anesthesia with the patient or authorized representative who has indicated his/her understanding and acceptance.   Dental advisory given  Plan Discussed with: CRNA  Anesthesia Plan Comments:         Anesthesia Quick Evaluation

## 2017-02-11 NOTE — Progress Notes (Signed)
Patient ID: Jacob Wagner, male   DOB: Jun 09, 1940, 75 y.o.   MRN: [id number] Displaced right intertrochanteric femur fracture  Ready for OR today  NPO Consent obtained through family Post-op plan to follow but likely WBAT due to dementia for transfers

## 2017-02-12 LAB — BASIC METABOLIC PANEL WITH GFR
Anion gap: 11 (ref 5–15)
BUN: 7 mg/dL (ref 6–20)
CO2: 28 mmol/L (ref 22–32)
Calcium: 8.2 mg/dL — ABNORMAL LOW (ref 8.9–10.3)
Chloride: 101 mmol/L (ref 101–111)
Creatinine, Ser: 0.62 mg/dL (ref 0.61–1.24)
GFR calc Af Amer: >60 mL/min
GFR calc non Af Amer: >60 mL/min
Glucose, Bld: 88 mg/dL (ref 65–99)
Potassium: 3.9 mmol/L (ref 3.5–5.1)
Sodium: 140 mmol/L (ref 135–145)

## 2017-02-12 LAB — CBC
HCT: 28.6 % — ABNORMAL LOW (ref 39.0–52.0)
Hemoglobin: 9.6 g/dL — ABNORMAL LOW (ref 13.0–17.0)
MCH: 30.5 pg (ref 26.0–34.0)
MCHC: 33.6 g/dL (ref 30.0–36.0)
MCV: 90.8 fL (ref 78.0–100.0)
Platelets: 184 K/uL (ref 150–400)
RBC: 3.15 MIL/uL — ABNORMAL LOW (ref 4.22–5.81)
RDW: 13.4 % (ref 11.5–15.5)
WBC: 8 K/uL (ref 4.0–10.5)

## 2017-02-12 NOTE — Evaluation (Signed)
Occupational Therapy Evaluation Patient Details Name: Jacob Wagner MRN: [id number] DOB: 21-May-1940 Today's Date: 02/12/2017    History of Present Illness pt was admitted after decline in functional mobility. He was found on floor, but at baseline, he got onto and up from floor independently.  Found to have R displaced intertrochanteric fx which was managed by ORIF.  H/O dementia, removal of thalamic brain tumor, colon CA and VP shunt   Clinical Impression   This 76 year old man was admitted for the above.  At baseline, he toilets himself but has assistance for all other ADLs.  Will follow in acute setting with toilet transfer goals and mobility related to adls to decrease burden of care.  Goals are for mod +2 assistance.  Pt currently needs total +2 assistance    Follow Up Recommendations  SNF    Equipment Recommendations  3 in 1 bedside commode    Recommendations for Other Services       Precautions / Restrictions Precautions Precautions: Fall Restrictions Other Position/Activity Restrictions: wbat      Mobility Bed Mobility Overal bed mobility: Needs Assistance Bed Mobility: Supine to Sit;Sit to Supine     Supine to sit: Total assist;+2 for physical assistance Sit to supine: Total assist;+2 for physical assistance      Transfers                 General transfer comment: not attempted this session    Balance Overall balance assessment: History of Falls;Needs assistance Sitting-balance support: Bilateral upper extremity supported;Feet supported Sitting balance-Leahy Scale:  (poor to fair) Sitting balance - Comments: initially mod A then min guard for safety; bil UE support                                   ADL either performed or assessed with clinical judgement   ADL Overall ADL's : Needs assistance/impaired                                       General ADL Comments: pt's baseline for adls is total A except for  toileting; he was mod I with this.  Pt unable to stand at this time due to pain. Sat EOB  with initial mod A then min guard for safety using bil UEs for support     Vision         Perception     Praxis      Pertinent Vitals/Pain Pain Assessment: Faces Faces Pain Scale: Hurts whole lot Pain Location: R LE with movement Pain Descriptors / Indicators: Grimacing;Moaning Pain Intervention(s): Limited activity within patient's tolerance;Monitored during session;Repositioned;Ice applied;RN gave pain meds during session     Hand Dominance     Extremity/Trunk Assessment Upper Extremity Assessment Upper Extremity Assessment: Overall WFL for tasks assessed (has bil tremors)           Communication Communication Communication: No difficulties   Cognition Arousal/Alertness: Awake/alert Behavior During Therapy: WFL for tasks assessed/performed Overall Cognitive Status: History of cognitive impairments - at baseline                                     General Comments       Exercises     Shoulder Instructions  Home Living Family/patient expects to be discharged to:: Skilled nursing facility Living Arrangements: Spouse/significant other;Children                                      Prior Functioning/Environment Level of Independence: Needs assistance        Comments: pt toileted himself.  Assist for all other adls including self feeding.  Pt has tremors        OT Problem List: Decreased strength;Decreased activity tolerance;Impaired balance (sitting and/or standing);Decreased cognition;Pain      OT Treatment/Interventions: Self-care/ADL training;DME and/or AE instruction;Patient/family education;Balance training    OT Goals(Current goals can be found in the care plan section) Acute Rehab OT Goals Patient Stated Goal: familys:  rehab to return to PLOF OT Goal Formulation: With family Time For Goal Achievement: 02/19/17 Potential  to Achieve Goals: Good ADL Goals Pt Will Transfer to Toilet: with mod assist;with +2 assist;bedside commode;stand pivot transfer Additional ADL Goal #1: pt will go from sit to stand with mod +2 assistance and maintain for 2 minutes with min A for adls Additional ADL Goal #2: Pt will perform bed mobility with mod +2 assistance in preparation for adls/toilet transfers  OT Frequency: Min 2X/week   Barriers to D/C:            Co-evaluation PT/OT/SLP Co-Evaluation/Treatment: Yes Reason for Co-Treatment: For patient/therapist safety;Complexity of the patient's impairments (multi-system involvement) PT goals addressed during session: Mobility/safety with mobility;Balance OT goals addressed during session: Strengthening/ROM      AM-PAC PT "6 Clicks" Daily Activity     Outcome Measure Help from another person eating meals?: Total Help from another person taking care of personal grooming?: Total Help from another person toileting, which includes using toliet, bedpan, or urinal?: Total Help from another person bathing (including washing, rinsing, drying)?: Total Help from another person to put on and taking off regular upper body clothing?: Total Help from another person to put on and taking off regular lower body clothing?: Total 6 Click Score: 6   End of Session    Activity Tolerance: Patient tolerated treatment well Patient left: in bed;with call bell/phone within reach;with bed alarm set;with family/visitor present  OT Visit Diagnosis: Pain Pain - Right/Left: Right Pain - part of body: Hip;Leg                Time: 4315-4008 OT Time Calculation (min): 26 min Charges:  OT General Charges $OT Visit: 1 Visit OT Evaluation $OT Eval Low Complexity: 1 Low G-Codes:     Craig, OTR/L [phone] 02/12/2017  [name] 02/12/2017, 1:42 PM

## 2017-02-12 NOTE — Plan of Care (Signed)
Problem: Education: Goal: Knowledge of  General Education information/materials will improve Outcome: Not Progressing D/t dementia.  Problem: Health Behavior/Discharge Planning: Goal: Ability to manage health-related needs will improve Outcome: Not Progressing Dementia, dependent in his care.

## 2017-02-12 NOTE — Evaluation (Signed)
Physical Therapy Evaluation Patient Details Name: Jacob Wagner MRN: [id number] DOB: 01-Oct-1940 Today's Date: 02/12/2017   History of Present Illness  pt was admitted after decline in functional mobility. He was found on floor, but at baseline, he got onto and up from floor independently.  Found to have R displaced intertrochanteric fx which was managed by ORIF.  H/O dementia, removal of thalamic brain tumor, colon CA and VP shunt  Clinical Impression  The patient tolerated gentle mobility to mobilize to sitting at the bed edge with 2 assist. The patient was  Pleasant and tolerated the activity. Pt admitted with above diagnosis. Pt currently with functional limitations due to the deficits listed below (see PT Problem List).  Pt will benefit from skilled PT to increase their independence and safety with mobility to allow discharge to the venue listed below.       Follow Up Recommendations SNF    Equipment Recommendations  None recommended by PT    Recommendations for Other Services       Precautions / Restrictions Precautions Precautions: Fall Restrictions Other Position/Activity Restrictions: wbat      Mobility  Bed Mobility Overal bed mobility: Needs Assistance Bed Mobility: Supine to Sit;Sit to Supine     Supine to sit: Total assist;+2 for physical assistance Sit to supine: Total assist;+2 for physical assistance      Transfers                 General transfer comment: not attempted this session  Ambulation/Gait                Stairs            Wheelchair Mobility    Modified Rankin (Stroke Patients Only)       Balance Overall balance assessment: History of Falls;Needs assistance Sitting-balance support: Bilateral upper extremity supported;Feet supported Sitting balance-Leahy Scale:  (poor to fair) Sitting balance - Comments: initially mod A then min guard for safety; bil UE support                                      Pertinent Vitals/Pain Pain Assessment: Faces Faces Pain Scale: Hurts whole lot Pain Location: R LE with movement Pain Descriptors / Indicators: Grimacing;Moaning Pain Intervention(s): Patient requesting pain meds-RN notified;Repositioned;RN gave pain meds during session;Ice applied    Home Living Family/patient expects to be discharged to:: Skilled nursing facility Living Arrangements: Spouse/significant other;Children                    Prior Function Level of Independence: Needs assistance         Comments: pt toileted himself.  Assist for all other adls including self feeding.  Pt has tremors     Hand Dominance        Extremity/Trunk Assessment   Upper Extremity Assessment Upper Extremity Assessment: Defer to OT evaluation    Lower Extremity Assessment Lower Extremity Assessment: RLE deficits/detail RLE Deficits / Details: decreased active movement due to pain as patient moans    Cervical / Trunk Assessment Cervical / Trunk Assessment: Normal  Communication   Communication: No difficulties  Cognition Arousal/Alertness: Awake/alert Behavior During Therapy: WFL for tasks assessed/performed Overall Cognitive Status: History of cognitive impairments - at baseline  General Comments      Exercises     Assessment/Plan    PT Assessment Patient needs continued PT services  PT Problem List Decreased strength;Decreased range of motion;Decreased activity tolerance;Decreased mobility;Decreased balance;Decreased knowledge of precautions;Decreased safety awareness;Decreased knowledge of use of DME;Decreased cognition       PT Treatment Interventions DME instruction;Gait training;Functional mobility training;Therapeutic activities;Therapeutic exercise;Patient/family education    PT Goals (Current goals can be found in the Care Plan section)  Acute Rehab PT Goals Patient Stated Goal: familys:  rehab  to return to PLOF PT Goal Formulation: With family Time For Goal Achievement: 02/26/17 Potential to Achieve Goals: Fair    Frequency Min 2X/week   Barriers to discharge        Co-evaluation PT/OT/SLP Co-Evaluation/Treatment: Yes Reason for Co-Treatment: For patient/therapist safety PT goals addressed during session: Mobility/safety with mobility OT goals addressed during session: ADL's and self-care       AM-PAC PT "6 Clicks" Daily Activity  Outcome Measure Difficulty turning over in bed (including adjusting bedclothes, sheets and blankets)?: Unable Difficulty moving from lying on back to sitting on the side of the bed? : Unable Difficulty sitting down on and standing up from a chair with arms (e.g., wheelchair, bedside commode, etc,.)?: Unable Help needed moving to and from a bed to chair (including a wheelchair)?: Total Help needed walking in hospital room?: Total Help needed climbing 3-5 steps with a railing? : Total 6 Click Score: 6    End of Session   Activity Tolerance: Patient tolerated treatment well Patient left: in bed;with call bell/phone within reach;with family/visitor present Nurse Communication: Mobility status PT Visit Diagnosis: Difficulty in walking, not elsewhere classified (R26.2);History of falling (Z91.81)    Time: 1300-1316 PT Time Calculation (min) (ACUTE ONLY): 16 min   Charges:   PT Evaluation $PT Eval Low Complexity: 1 Low     PT G CodesTresa Wagner PT [phone]   Jacob Wagner 02/12/2017, 2:48 PM

## 2017-02-12 NOTE — Care Management Important Message (Signed)
Important Message  Patient Details  Name: Jacob Wagner MRN: [id number] Date of Birth: 1941-03-06   Medicare Important Message Given:  Yes    Kerin Salen 02/12/2017, 11:04 AMImportant Message  Patient Details  Name: Jacob Wagner MRN: [id number] Date of Birth: 06/13/1940   Medicare Important Message Given:  Yes    Kerin Salen 02/12/2017, 11:04 AM

## 2017-02-12 NOTE — Progress Notes (Signed)
     Subjective: 1 Day Post-Op Procedure(s) (LRB): INTRAMEDULLARY (IM) NAIL FEMORAL RIGHT (Right)   Patient resting comfortably in bed.  Expressing no pain. No reported events throughout the night.    Objective:   VITALS:   Vitals:   02/12/17 0300 02/12/17 0503  BP: (!) 114/59 100/75  Pulse: 72 70  Resp: 16 15  Temp: 97.8 F (36.6 C) 98.2 F (36.8 C)  SpO2: 98% 100%    Incision: dressing C/D/I No cellulitis present Compartment soft  LABS  Recent Labs  02/10/17 0534 02/12/17 0546  HGB 10.4* 9.6*  HCT 30.8* 28.6*  WBC 7.4 8.0  PLT 194 184     Recent Labs  02/10/17 0534 02/12/17 0546  NA 137 140  K 3.9 3.9  BUN 11 7  CREATININE 0.73 0.62  GLUCOSE 91 88     Assessment/Plan: 1 Day Post-Op Procedure(s) (LRB): INTRAMEDULLARY (IM) NAIL FEMORAL RIGHT (Right) Up with therapy Discharge disposition to be determined  Ortho recommendations:  ASA 325 mg bid for 4 weeks for anticoagulation (Rx written), unless other medically indicated.  Norco for pain management (Rx written).  MiraLax and Colace for constipation  Iron 325 mg tid for 2-3 weeks   WBAT on the right leg.  Dressings to remain in place until follow in clinic in 2 weeks.  Dressings are waterproof and may shower with them in place.  Follow up in 2 weeks at [facility]. Follow up with OLIN,Eugene Zeiders D in 2 weeks.  Contact information:  [facility] [address], [address] [place]ia Siefken   PAC  02/12/2017, 8:38 AM

## 2017-02-12 NOTE — Op Note (Signed)
Jacob Wagner, Jacob Wagner NO.:  [id number]  MEDICAL RECORD NO.:  [id number]  LOCATION:                                 FACILITY:  PHYSICIAN:  Pietro Cassis. Alvan Dame, M.D.       DATE OF BIRTH:  DATE OF PROCEDURE:  02/11/2017 DATE OF DISCHARGE:                              OPERATIVE REPORT   PREOPERATIVE DIAGNOSIS:  Displaced angulated right intertrochanteric femur fracture.  POSTOPERATIVE DIAGNOSIS:  Displaced angulated right intertrochanteric femur fracture.  PROCEDURE:  Open reduction and internal fixation of right intertrochanteric femur fracture utilizing Biomet Affixus nail 11 x 180 mm with 130-degree lag screw and distal interlock.  SURGEON:  Pietro Cassis. Alvan Dame, M.D.  ASSISTANT:  Jacob Massed, PA-C.  Note that Ms. Blakely was present for the entirety of the case from preoperative position, perioperative management of the operative extremity, general facilitation of the case and primary wound closure.  ANESTHESIA:  General.  SPECIMENS:  None.  COMPLICATIONS:  None.  BLOOD LOSS:  Less than 150 mL.  DRAINS:  None.  INDICATIONS FOR PROCEDURE:  [name] is a pleasant, but demented 76- year-old male with significant medical comorbidities.  He currently resides in a home environment with [facility].  He was found to be down after an extended period of time.  He was brought to the emergency room where radiographs revealed that he had a displaced intertrochanteric femur fracture.  He was admitted to the hospital. There was some discussion on whether or not an operation would be indicated for him due to his medical comorbidities; however, due to the palliative nature and pain control measures, it was recommended that he proceed in this fashion.  Consent was obtained from his family after reviewing the risks of infection, malunion, nonunion.  Consent was obtained.  PROCEDURE IN DETAIL:  The patient was brought to the operative theater. Once adequate  anesthesia, preoperative antibiotics, Ancef administered, he had already been on Rocephin provided through the medical physicians last night.  He was positioned on the Hana table with bony prominences padded.  Once this was satisfactorily cared for, his left lower extremity was positioned in the leg holder with bony prominences padded particularly over the peroneal nerve.  The perineal post was applied, traction-internal rotation was applied on the operative extremity.  Fluoroscopy was used to identify the reduction on AP view, it looked pretty good; however, he had a significant apex anterior deformity at the fracture site.  I felt that I would address this intraoperatively.  At this point, the right hip was prepped and draped in sterile fashion using shower curtain technique.  Time-out was performed identifying the patient, planned procedure and extremity.  Fluoroscopy was brought back to the field and using the guidewire, the tip of the trochanter was identified.  A proximal based incision was made.  Soft tissue dissection was carried down to and then through the gluteal fascia.  The guidewire was then inserted into the tip of the trochanter and passed across the fracture site, confirmed radiographically.  I then drilled the proximal femur and then passed the nail by hand.  With the nail and appropriate place in a caudal cephalad direction with  the insertion jig in place, I then appropriately positioned the incision for the lag screw.  I extended this just a little bit in order for me to place a Cobb elevator on top of the fracture site so I could better reduce the apex deformity.  Under fluoroscopic imaging with this Cobb elevator and having the fracture reduced and there could an anatomic position as I could, I passed a guidewire, which sat basically in the inferior center portion of the femoral head and slightly centered it posterior on the lateral view.  Once this was then  placed, I measured and selected a 105-mm lag screw.  I drilled for this.  The lag screw was then placed and then the compression wheel was utilized to medialize the shaft of the fracture site.  Once it was compressed, I went ahead and locked this down based on his history of dementia to prevent any potential complication of excessive weightbearing.  Once I was satisfied with this reduction, the distal interlock was positioned through the jig.  Wounds were irrigated.  The proximal wound was closed in layers with #1 Vicryl in the gluteal fascia and iliotibial band.  The remainder of the wound was closed with 2-0 Vicryl.  The subcutaneous layer was closed with Monocryl suture.  The thigh wounds were cleaned, dried and dressed sterilely using surgical glue and Aquacel dressing.  Then, he was extubated and brought to the recovery room in stable condition. Findings were reviewed with his family.     Pietro Cassis Alvan Dame, M.D.     MDO/MEDQ  D:  02/11/2017  T:  02/12/2017  Job:  [id number]

## 2017-02-12 NOTE — Progress Notes (Signed)
PROGRESS NOTE  Brief History: Jacob Wagner is a 76 y.o. male with a complicated medical history including advanced dementia, thalamic brain tumor resection, VP shunt, seizure disorder and colon CA s/p right hemicolectomy who presented from home to [facility] ED 10/6 after refusing to bear weight. His family had found him laying on the floor the previous day, moved him to the couch, and he had not gotten up since that time. XR confirmed angulated, displaced right hip fracture. Orthopedics recommended transfer to [facility] for operative management. IM nail was placed by Dr. Alvan Dame 02/11/2017 and postoperative course has been uncomplicated.   Subjective: No events postoperatively.   Objective: BP 118/62 (BP Location: Left Arm)   Pulse 85   Temp 98.4 F (36.9 C) (Oral)   Resp 16   Ht 6' (1.829 m)   Wt 59 kg (130 lb)   SpO2 100%   BMI 17.63 kg/m   Gen: Thin, alert male resting quietly in no distress. Interactive this morning HEENT: Right VP shunt appears normal. [place]/AT Pulm: Clear and nonlabored on room air  CV: RRR, no murmur, no JVD, no edema GI: Soft, NT, ND, +BS. No erythema or discharge.  Neuro: Alert and interactive, not oriented. No focal deficits. Ext: RLE with resolved angular deformity, dressings c/d/i, distal sensorimotor exam wnl with brisk cap refill and palpable DP pulses. No restraints present.  Assessment & Plan: Closed, displaced, angulated intertrochanteric right hip fracture: Due to unwitnessed fall. s/p IM nail 10/9 by Dr. Alvan Dame.  - WBAT right leg.  - DVT ppx per orthopedics: ASA 325mg  po BID x4 weeks - Norco prn for pain management per orthopedics - Iron 325mg  po TID x3 weeks - Dressings to remain in place, these are waterproof. Will follow up with Dr. Alvan Dame at [facility] in 2 weeks.  - PT eval postoperatively: SW consulted.  - Blood counts stable, renal function stable.   UTI: Noted on UA at [facility]. No sepsis. Urine culture no growth. s/p ceftriaxone x3  days.  Hydrocephalus: Reportedly worsening ventricular dilation compared to earlier 2018 suggestive of ?VP shunt malfunction.   - Discussed with neurosurgery 10/7, please see note. Wife has clearly declined any plans for operative management regardless of shunt functional status, so no further work up is planned. - Continue AED  Advanced dementia: Chronic, stable. Anticipate decompensating disease trajectory following this event.  - I appreciate palliative care assistance with pain management and goals of care discussions.  Vance Gather, MD [facility] Pager [phone] 02/12/2017, 1:15 PM

## 2017-02-12 NOTE — NC FL2 (Signed)
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 Acute lower UTI 02/09/2017  . Closed intertrochanteric fracture of right hip, initial encounter ([place]) 02/09/2017  . Encounter for palliative care   . Goals of care, counseling/discussion   . UTI (urinary tract infection) 11/19/2014  . E. coli UTI 11/19/2014  . Sepsis ([place]) 11/19/2014  . Protein-calorie malnutrition, severe ([place]) 11/17/2014  . Altered mental status 11/16/2014  . Abnormality of gait 09/20/2014  . Memory difficulties 09/20/2014  . Fall at home 03/22/2013  . Rhabdomyolysis 03/22/2013  . Acute encephalopathy 03/22/2013  . Seizure disorder ([place]) 03/22/2013  . Dementia 03/22/2013  . 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 Colon cancer ([place]) 04/16/2012  . Weight loss 04/16/2012  . Early satiety 04/16/2012  . Early satiety 04/16/2012    Orientation RESPIRATION BLADDER Height & Weight     Self  Normal Incontinent, External catheter Weight: 130 lb (59 kg) Height:  6' (182.9 cm)  BEHAVIORAL SYMPTOMS/MOOD NEUROLOGICAL BOWEL NUTRITION STATUS      Continent Diet: See d/c summary  AMBULATORY STATUS COMMUNICATION OF NEEDS Skin   Extensive Assist Verbally  (Surgical Incision- Right Hip)                       Personal Care Assistance Level of  Assistance  Bathing, Feeding, Dressing Bathing Assistance: Limited assistance Feeding assistance: Limited assistance Dressing Assistance: Limited assistance     Functional Limitations Info  Sight, Hearing, Speech Sight Info: Adequate Hearing Info: Adequate Speech Info: Impaired    SPECIAL CARE FACTORS FREQUENCY  PT (By licensed PT), OT (By licensed OT)     PT Frequency: 5x/week OT Frequency: 5x/week            Contractures Contractures Info: Not present    Additional Factors Info  Code Status, Allergies Code Status Info: DNR Allergies Info: Aricept Donepezil Hcl, Ciprofloxacin, Flomax Tamsulosin Hcl, Ibuprofen, Levaquin Levofloxacin In D5w, Lipitor Atorvastatin, Namenda Memantine Hcl, Other           Current Medications (02/12/2017):  This is the current hospital active medication list Current Facility-Administered Medications  Medication Dose Route Frequency Provider Last Rate Last Dose  . 0.9 %  sodium chloride infusion   Intravenous Continuous Etta Quill, DO 10 mL/hr at 02/12/17 9622    . aspirin EC tablet 325 mg  325 mg Oral BID Danae Orleans, PA-C   325 mg at 02/12/17 0820  . feeding supplement (ENSURE ENLIVE) (ENSURE ENLIVE) liquid 237 mL  237 mL Oral TID BM Patrecia Pour, MD   237 mL at 02/11/17 2131  . ferrous sulfate tablet 325 mg  325 mg Oral TID PC Babish, Matthew, PA-C   325 mg at 02/12/17 1249  . HYDROcodone-acetaminophen (NORCO/VICODIN) 5-325 MG per tablet 1-2 tablet  1-2 tablet Oral Q6H PRN Etta Quill, DO   1 tablet at 02/12/17 1249  . lactulose (CHRONULAC) 10 GM/15ML solution 10-20 g  10-20 g Oral QID PRN Etta Quill, DO      . menthol-cetylpyridinium (CEPACOL) lozenge 3 mg  1 lozenge Oral PRN Danae Orleans, PA-C       Or  . phenol (CHLORASEPTIC) mouth spray 1 spray  1 spray Mouth/Throat PRN Babish, Matthew, PA-C      . metoCLOPramide (REGLAN) tablet 5-10 mg  5-10 mg Oral Q8H PRN Danae Orleans, PA-C       Or  . metoCLOPramide  (REGLAN) injection 5-10 mg  5-10 mg Intravenous Q8H PRN Danae Orleans, PA-C      . morphine 4 MG/ML injection 0.52 mg  0.52 mg Intravenous Q2H PRN Etta Quill, DO   0.52 mg at 02/09/17 2246  . ondansetron (ZOFRAN) tablet 4 mg  4 mg Oral Q6H PRN Danae Orleans, PA-C       Or  . ondansetron [facility]) injection 4 mg  4 mg Intravenous Q6H PRN Babish, Matthew, PA-C      . phenytoin (DILANTIN) ER capsule 100 mg  100 mg Oral BID Jennette Kettle M, DO   100 mg at 02/12/17 0820     Discharge Medications: Please see discharge summary for a list of discharge medications.  Relevant Imaging Results:  Relevant Lab Results:   Additional Information VQX:[id number]  Lia Hopping, LCSW

## 2017-02-13 LAB — CBC
HCT: 24.4 % — ABNORMAL LOW (ref 39.0–52.0)
HEMOGLOBIN: 8.4 g/dL — AB (ref 13.0–17.0)
MCH: 31.1 pg (ref 26.0–34.0)
MCHC: 34.4 g/dL (ref 30.0–36.0)
MCV: 90.4 fL (ref 78.0–100.0)
PLATELETS: 211 10*3/uL (ref 150–400)
RBC: 2.7 MIL/uL — AB (ref 4.22–5.81)
RDW: 13.5 % (ref 11.5–15.5)
WBC: 7 10*3/uL (ref 4.0–10.5)

## 2017-02-13 LAB — BASIC METABOLIC PANEL
Anion gap: 7 (ref 5–15)
BUN: 10 mg/dL (ref 6–20)
CHLORIDE: 99 mmol/L — AB (ref 101–111)
CO2: 32 mmol/L (ref 22–32)
CREATININE: 0.6 mg/dL — AB (ref 0.61–1.24)
Calcium: 8.2 mg/dL — ABNORMAL LOW (ref 8.9–10.3)
Glucose, Bld: 99 mg/dL (ref 65–99)
POTASSIUM: 3.4 mmol/L — AB (ref 3.5–5.1)
SODIUM: 138 mmol/L (ref 135–145)

## 2017-02-13 MED ORDER — POTASSIUM CHLORIDE CRYS ER 20 MEQ PO TBCR
20.0000 meq | EXTENDED_RELEASE_TABLET | Freq: Once | ORAL | Status: AC
  Administered 2017-02-13: 20 meq via ORAL
  Filled 2017-02-13: qty 1

## 2017-02-13 MED ORDER — DIPHENOXYLATE-ATROPINE 2.5-0.025 MG PO TABS: 2.0000 | ORAL_TABLET | Freq: Every day | ORAL | 0 refills | Status: AC | PRN

## 2017-02-13 MED ORDER — PHENYTOIN SODIUM EXTENDED 100 MG PO CAPS: 100.0000 mg | ORAL_CAPSULE | Freq: Two times a day (BID) | ORAL | 0 refills | Status: AC

## 2017-02-13 MED ORDER — FERROUS SULFATE 325 (65 FE) MG PO TABS: 325.0000 mg | ORAL_TABLET | Freq: Three times a day (TID) | ORAL | Status: AC

## 2017-02-13 NOTE — Care Management Note (Signed)
Case Management Note  Patient Details  Name: MARCELLAS MARCHANT MRN: [id number] Date of Birth: 1940/12/31  Subjective/Objective: No CM needs. D/c SNF-CSW managing.                   Action/Plan:d/c SNF.   Expected Discharge Date:  02/13/17               Expected Discharge Plan:  Skilled Nursing Facility  In-House Referral:  Clinical Social Work  Discharge planning Services  CM Consult  Post Acute Care Choice:    Choice offered to:     DME Arranged:    DME Agency:     HH Arranged:    [place] Agency:     Status of Service:  Completed, signed off  If discussed at [employer], dates discussed:    Additional Comments:  Dessa Phi, RN 02/13/2017, 11:41 AM

## 2017-02-13 NOTE — Plan of Care (Signed)
Problem: Safety: Goal: Ability to remain free from injury will improve Outcome: Progressing Safety precautions maintained  Problem: Skin Integrity: Goal: Risk for impaired skin integrity will decrease Outcome: Progressing No skin issues noted  Problem: Nutrition: Goal: Adequate nutrition will be maintained Outcome: Progressing Given Ensure supplent , he took 100%  Problem: Bowel/Gastric: Goal: Will not experience complications related to bowel motility Outcome: Progressing No bowel complications noted  Problem: Pain Management: Goal: Pain level will decrease Outcome: Progressing Medicated once for pain with moderate relief, sleepin at present time

## 2017-02-13 NOTE — Clinical Social Work Placement (Addendum)
1:58PM PTAR called transport. ETA:1 Hour.   Patient accepted to [facility]. [place]. D/C Summary faxed.  Patient daughter Bailey Mech at bedside notified.  Patient can transport after lunch. Med. Nec. Complete. Nurse call report to [phone].    CLINICAL SOCIAL WORK PLACEMENT  NOTE  Date:  02/13/2017  Patient Details  Name: Jacob Wagner MRN: [id number] Date of Birth: 14-Dec-1940  Clinical Social Work is seeking post-discharge placement for this patient at the [place] level of care (*CSW will initial, date and re-position this form in  chart as items are completed):  Yes   Patient/family provided with [place] Work Department's list of facilities offering this level of care within the geographic area requested by the patient (or if unable, by the patient's family).  Yes   Patient/family informed of their freedom to choose among providers that offer the needed level of care, that participate in Medicare, Medicaid or managed care program needed by the patient, have an available bed and are willing to accept the patient.  Yes   Patient/family informed of [place]'s ownership interest in [facility] and [facility], as well as of the fact that they are under no obligation to receive care at these facilities.  PASRR submitted to EDS on       PASRR number received on       Existing PASRR number confirmed on 02/12/17     FL2 transmitted to all facilities in geographic area requested by pt/family on       FL2 transmitted to all facilities within larger geographic area on 02/11/17     Patient informed that his/her managed care company has contracts with or will negotiate with certain facilities, including the following:  [facility]     Yes   Patient/family informed of bed offers received.  Patient chooses bed at [facility]     Physician recommends and patient chooses bed at      Patient to  be transferred to [facility] on 02/13/17.  Patient to be transferred to facility by PTAR     Patient family notified on 02/13/17 of transfer.  Name of family member notified:  Daughter     PHYSICIAN Please sign DNR     Additional Comment:    _______________________________________________ Lia Hopping, LCSW 02/13/2017, 11:15 AM

## 2017-02-13 NOTE — Progress Notes (Signed)
Nutrition Follow-up  DOCUMENTATION CODES:   Underweight  INTERVENTION:  - Continue Ensure Enlive TID. - Continue to encourage PO intakes of meals and supplements.   NUTRITION DIAGNOSIS:   Inadequate oral intake related to acute illness (recurrent UTIs, fall x2 with hip fx)) as evidenced by per patient/family report. -improving s/p surgery.   GOAL:   Patient will meet greater than or equal to 90% of their needs -beginning to meet   MONITOR:   PO intake, Supplement acceptance, Weight trends, Labs  ASSESSMENT:   76 y.o. 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  Patient presented to the ED at [facility] last night for evaluation of R hip pain.  Patient has advanced dementia, and cant really contribute much to history, although based on notes from his UTI admit to our AP service in April this year, this is baseline.  10/11 Diet advanced from NPO to CLD 10/9 at 1500 and then to Regular 10/10 at 0823. Patient consumed 25% of breakfast (210 kcal, 6 grams of protein), 80% of lunch (620 kcal, 18 grams of protein), and 100% of dinner (590 kcal, 30 grams of protein) yesterday and 100% of breakfast (470 kcal, 17 grams of protein) this morning. Ensure Enlive is ordered TID and pt has mainly been accepting this supplement yesterday and this AM. No new weight since date of admission. Discharge summary in but no discharge order yet.  Medications reviewed; K: 3.4 mmol/L, Cl: 99 mmol/L, creatinine: 0.6 mg/dL, Ca: 8.2 mg/dL. Labs reviewed; 325 mg ferrous sulfate TID, 20 mEq oral KCl x1 dose today.   IVF: NS @ 75 mL/hr.    10/8 - Lunch tray in the room but mainly untouched.  - Pt's daughter, who lives in [place]., is at bedside and states that she is working and on the phone so discussion was fairly brief.  - Her two sisters live in town and a  younger sister is the primary caregiver for the patient and patient's wife.  - Pt usually has a good appetite and will ask for food throughout the day, but that he has been on antibiotics for several weeks d/t UTI and that appetite and intakes have been decreased during that time. - She reports plan for surgery tomorrow d/t fx.  - Unable to obtain information about weight trends recently, any difficulties with chewing or swallowing, if patient is able to self-feed, or if he is provided with any oral nutrition supplements (such as Ensure) at home. - Physical assessment deferred as daughter reports that pt has been combative throughout the day.  - Able to visualize some degree of muscle and fat wasting throughout upper body.  - Per chart review, pt has lost 13 lbs (9% body weight) in the past 6 months; this is not significant for time frame but could be significant in light of underweight BMI.   Highly suspect some degree of malnutrition but do not feel comfortable identifying degree of malnutrition without additional information about weight or performing NFPE.  IVF: NS @ 75 mL/hr.    Diet Order:  Diet regular Room service appropriate? Yes; Fluid consistency: Thin  Skin:  Wound (see comment) (R hip incision from 10/9)  Last BM:  10/5 (PTA)  Height:   Ht Readings from Last 1 Encounters:  02/09/17 6' (1.829 m)    Weight:   Wt Readings from Last 1  Encounters:  02/09/17 130 lb (59 kg)    Ideal Body Weight:  80.91 kg  BMI:  Body mass index is 17.63 kg/m.  Estimated Nutritional Needs:   Kcal:  1650-1890 (28-32 kcal/kg)  Protein:  70-83 grams (1.2-1.4 grams/kg)  Fluid:  >/= 1.8 L/day  EDUCATION NEEDS:   No education needs identified at this time    Jarome Matin, MS, RD, LDN, CNSC Inpatient Clinical Dietitian Pager # [phone] After hours/weekend pager # [phone]

## 2017-02-13 NOTE — Progress Notes (Signed)
Patient ID: Jacob Wagner, male   DOB: 1940/12/16, 76 y.o.   MRN: [id number] Subjective: 2 Days Post-Op Procedure(s) (LRB): INTRAMEDULLARY (IM) NAIL FEMORAL RIGHT (Right)    Patient very demented, sleeping at time of visit, no family in room Seems comfortable No events reported, easily agitated  Objective:   VITALS:   Vitals:   02/12/17 2203 02/13/17 0522  BP: (!) 114/56 (!) 115/56  Pulse: 82 85  Resp: 16 16  Temp: 98.9 F (37.2 C) 98.4 F (36.9 C)  SpO2: 97% 98%    Incision: dressing C/D/I - right hip  LABS  Recent Labs  02/12/17 0546 02/13/17 0535  HGB 9.6* 8.4*  HCT 28.6* 24.4*  WBC 8.0 7.0  PLT 184 211     Recent Labs  02/12/17 0546 02/13/17 0535  NA 140 138  K 3.9 3.4*  BUN 7 10  CREATININE 0.62 0.60*  GLUCOSE 88 99    No results for input(s): LABPT, INR in the last 72 hours.   Assessment/Plan: 2 Days Post-Op Procedure(s) (LRB): INTRAMEDULLARY (IM) NAIL FEMORAL RIGHT (Right)   Advance diet Up with therapy Discharge to SNF - D/C pending RTC in 2 weeks for wound check and X-rays

## 2017-02-13 NOTE — Discharge Summary (Signed)
Physician Discharge Summary  Jacob Wagner QVZ:[id number] DOB: [birth year] DOA: 02/09/2017  PCP: Redmond School, MD  Admit date: 02/09/2017 Discharge date: 02/13/2017  [facility] Disposition: SNF   Recommendations for Outpatient Follow-up:  1. Follow up with PCP in 1-2 weeks 2. Palliative care services should follow patient at SNF. DNR confirmed.  3. Please obtain BMP/CBC in one week 4. Follow up with Dr. Alvan Dame in 2 weeks.  Home Health: None Equipment/Devices: None Discharge Condition: Stable CODE STATUS: DNR Diet recommendation: Regular  Brief/Interim Summary: Jacob Wagner is a 76 y.o. male with a complicated medical history including advanced dementia, thalamic brain tumor resection, VP shunt, seizure disorder and colon CA s/p right hemicolectomy who presented from home to [facility] ED 10/6 after refusing to bear weight. His family had found him laying on the floor the previous day, moved him to the couch, and he had not gotten up since that time. XR confirmed angulated, displaced right hip fracture. Orthopedics recommended transfer to [facility] for operative management. IM nail was placed by Dr. Alvan Dame 02/11/2017 and postoperative course has been uncomplicated.   Discharge Diagnoses:  Principal Problem:   Closed intertrochanteric fracture of right hip, initial encounter [facility]) Active Problems:   Colon cancer ([place])   Seizure disorder ([place])   Dementia   VP (ventriculoperitoneal) shunt status   Acute lower UTI   Encounter for palliative care   Goals of care, counseling/discussion  Closed, displaced, angulated intertrochanteric right hip fracture: Due to unwitnessed fall. s/p IM nail 10/9 by Dr. Alvan Dame.  - WBAT right leg.  - DVT ppx per orthopedics: ASA 325mg  po BID x4 weeks - Norco prn for pain management per orthopedics - Iron 325mg  po TID x3 weeks - Dressings to remain in place, these are waterproof. Will follow up with Dr. Alvan Dame at [facility] in 2 weeks.   - Continue PT and SNF. - Blood counts stable, renal function stable. Recheck in 1 week.  UTI: Noted on UA at [facility]. No sepsis. Urine culture no growth. s/p ceftriaxone x3 days.  Hydrocephalus: Reportedly worsening ventricular dilation compared to earlier 2018 suggestive of ?VP shunt malfunction.   - Discussed with neurosurgery 10/7, please see note. Wife has clearly declined any plans for operative management regardless of shunt functional status, so no further work up is planned. - Continue AED  Advanced dementia: Chronic, stable. Anticipate decompensating disease trajectory following this event.  - I appreciate palliative care assistance with pain management and goals of care discussions. Palliative care services should continue at SNF.  Discharge Instructions Discharge Instructions    Discharge patient    Complete by:  As directed    Discharge disposition:  03-Skilled [place]   Discharge patient date:  02/13/2017   Weight bearing as tolerated    Complete by:  As directed    Laterality:  left   Extremity:  Lower     Allergies as of 02/13/2017      Reactions   Aricept [donepezil Hcl] Diarrhea   Ciprofloxacin Itching   Flomax [tamsulosin Hcl] Other (See Comments)   Per daughter, reaction unknown   Ibuprofen Other (See Comments)   Per daughter, reaction unknown   Levaquin [levofloxacin In D5w] Other (See Comments)   Per daughter, reaction unknown   Lipitor [atorvastatin] Other (See Comments)   dizziness   Namenda [memantine Hcl] Itching   itch   Other Other (See Comments)   Wife states NO MRI due to shunt in pts head.  Medication List    TAKE these medications   aspirin 81 MG chewable tablet Commonly known as:  ASPIRIN CHILDRENS Chew 1 tablet (81 mg total) by mouth 2 (two) times daily. Take for 4 weeks.   diphenoxylate-atropine 2.5-0.025 MG tablet Commonly known as:  LOMOTIL Take 2 tablets by mouth daily. Reported on 06/27/2015   ferrous  sulfate 325 (65 FE) MG tablet Take 1 tablet (325 mg total) by mouth 3 (three) times daily after meals.   HYDROcodone-acetaminophen 5-325 MG tablet Commonly known as:  NORCO/VICODIN Take 1-2 tablets by mouth every 6 (six) hours as needed for moderate pain.   phenytoin 100 MG ER capsule Commonly known as:  DILANTIN Take 1 capsule (100 mg total) by mouth 2 (two) times daily.            Discharge Care Instructions        Start     Ordered   02/11/17 0000  Weight bearing as tolerated    Question Answer Comment  Laterality left   Extremity Lower      02/11/17 1216      Contact information for follow-up providers    Paralee Cancel, MD. Schedule an appointment as soon as possible for a visit in 2 week(s).   Specialty:  Orthopedic Surgery Contact information: [address] [place] [address] [phone]            Contact information for after-discharge care    [facility] Follow up.   Specialty:  [place] information: [address] [place] [phone]                 Allergies  Allergen Reactions  . Aricept [Donepezil Hcl] Diarrhea  . Ciprofloxacin Itching  . Flomax [Tamsulosin Hcl] Other (See Comments)    Per daughter, reaction unknown  . Ibuprofen Other (See Comments)    Per daughter, reaction unknown  . Levaquin [Levofloxacin In D5w] Other (See Comments)    Per daughter, reaction unknown  . Lipitor [Atorvastatin] Other (See Comments)    dizziness  . Namenda [Memantine Hcl] Itching    itch  . Other Other (See Comments)    Wife states NO MRI due to shunt in pts head.     Consultations:  Palliative care  Orthopedics  Neurosurgery by phone  Procedures/Studies: Dg Pelvis Portable  Result Date: 02/09/2017 CLINICAL DATA:  Pt was found on the floor yesterday sleeping and is unable to walk. Does not allow anyone to touch his right hip.  History of right hip fracture. EXAM: PORTABLE PELVIS 1-2 VIEWS COMPARISON:  02/08/2017 FINDINGS: Generalized osteopenia. Comminuted right intertrochanteric fracture with varus angulation. No other fracture or dislocation. IMPRESSION: 1. Comminuted right intertrochanteric fracture with varus angulation. Electronically Signed   By: Kathreen Devoid   On: 02/09/2017 08:09   Chest Portable 1 View  Result Date: 02/09/2017 CLINICAL DATA:  Right hip fracture. EXAM: PORTABLE CHEST 1 VIEW COMPARISON:  08/08/2016 FINDINGS: Chronic elevation of the left hemidiaphragm. Chronic aortic atherosclerosis. VP shunt overlies the right chest. Mild bilateral lower lobe hypo aeration. Chronic calcified granuloma on the right. IMPRESSION: Mild hypo aeration in both lower lobes. Chronic elevation of the left hemidiaphragm. No active disease otherwise. Electronically Signed   By: Nelson Chimes M.D.   On: 02/09/2017 07:48   Dg C-arm 1-60 Min-no Report  Result Date: 02/11/2017 Fluoroscopy was utilized by the requesting physician.  No radiographic interpretation.  Subjective: Feels well this morning, eating well. More alert than previously.   Discharge Exam: Vitals:   02/12/17 2203 02/13/17 0522  BP: (!) 114/56 (!) 115/56  Pulse: 82 85  Resp: 16 16  Temp: 98.9 F (37.2 C) 98.4 F (36.9 C)  SpO2: 97% 98%   General: Pt is alert, awake, not in acute distress Cardiovascular: RRR, S1/S2 +, no rubs, no gallops Respiratory: CTA bilaterally, no wheezing, no rhonchi Abdominal: Soft, NT, ND, bowel sounds + Extremities: RLE w/lateral dressing c/d/i, no deformity. Compartment soft. Feet warm, brisk cap refill, no sensorimotor deficits.   Labs: Basic Metabolic Panel:  Recent Labs Lab 02/10/17 0534 02/12/17 0546 02/13/17 0535  NA 137 140 138  K 3.9 3.9 3.4*  CL 98* 101 99*  CO2 29 28 32  GLUCOSE 91 88 99  BUN 11 7 10   CREATININE 0.73 0.62 0.60*  CALCIUM 8.4* 8.2* 8.2*   Liver Function Tests:  Recent Labs Lab  02/10/17 0534  AST 30  ALT 17  ALKPHOS 214*  BILITOT 0.8  PROT 6.4*  ALBUMIN 3.3*   CBC:  Recent Labs Lab 02/10/17 0534 02/12/17 0546 02/13/17 0535  WBC 7.4 8.0 7.0  HGB 10.4* 9.6* 8.4*  HCT 30.8* 28.6* 24.4*  MCV 90.3 90.8 90.4  PLT 194 184 211   Urinalysis    Component Value Date/Time   COLORURINE YELLOW 08/08/2016 1700   APPEARANCEUR HAZY (A) 08/08/2016 1700   LABSPEC 1.023 08/08/2016 1700   PHURINE 5.0 08/08/2016 1700   GLUCOSEU NEGATIVE 08/08/2016 1700   HGBUR LARGE (A) 08/08/2016 1700   BILIRUBINUR NEGATIVE 08/08/2016 1700   KETONESUR NEGATIVE 08/08/2016 1700   PROTEINUR NEGATIVE 08/08/2016 1700   UROBILINOGEN 4.0 (H) 11/16/2014 1220   NITRITE POSITIVE (A) 08/08/2016 1700   LEUKOCYTESUR TRACE (A) 08/08/2016 1700    Microbiology Recent Results (from the past 240 hour(s))  Culture, Urine     Status: None   Collection Time: 02/10/17  1:00 AM  Result Value Ref Range Status   Specimen Description URINE, CLEAN CATCH  Final   Special Requests NONE  Final   Culture   Final    NO GROWTH Performed at [facility], [place] [address], [place], [place] [address]    Report Status 02/11/2017 FINAL  Final  Surgical PCR screen     Status: None   Collection Time: 02/11/17  2:08 AM  Result Value Ref Range Status   MRSA, PCR NEGATIVE NEGATIVE Final   Staphylococcus aureus NEGATIVE NEGATIVE Final    Comment: (NOTE) The Xpert SA Assay (FDA approved for NASAL specimens in patients 51 years of age and older), is one component of a comprehensive surveillance program. It is not intended to diagnose infection nor to guide or monitor treatment.     Time coordinating discharge: Approximately 40 minutes  Vance Gather, MD  [facility] 02/13/2017, 11:24 AM Pager [phone]

## 2017-02-14 LAB — BASIC METABOLIC PANEL
Anion gap: 8 (ref 5–15)
BUN: 7 mg/dL (ref 6–20)
CHLORIDE: 101 mmol/L (ref 101–111)
CO2: 30 mmol/L (ref 22–32)
CREATININE: 0.52 mg/dL — AB (ref 0.61–1.24)
Calcium: 8 mg/dL — ABNORMAL LOW (ref 8.9–10.3)
GFR calc non Af Amer: >60 mL/min (ref >60)
Glucose, Bld: 90 mg/dL (ref 65–99)
POTASSIUM: 3.9 mmol/L (ref 3.5–5.1)
SODIUM: 139 mmol/L (ref 135–145)

## 2017-02-14 LAB — CBC
HEMATOCRIT: 24.7 % — AB (ref 39.0–52.0)
HEMOGLOBIN: 8.3 g/dL — AB (ref 13.0–17.0)
MCH: 30.6 pg (ref 26.0–34.0)
MCHC: 33.6 g/dL (ref 30.0–36.0)
MCV: 91.1 fL (ref 78.0–100.0)
Platelets: 232 10*3/uL (ref 150–400)
RBC: 2.71 MIL/uL — AB (ref 4.22–5.81)
RDW: 13.7 % (ref 11.5–15.5)
WBC: 5.8 10*3/uL (ref 4.0–10.5)

## 2017-02-14 NOTE — Progress Notes (Signed)
Patient will discharge to [facility] confirmed with A/C-Patricia, they are ready for patient.  CSW informed pt. Spouse and daughter Jacob Wagner.  PTAR scheduled for 11:30-12:00pm today.   Kathrin Greathouse, Latanya Presser, MSW Clinical Social Worker  [phone] 02/14/2017  9:59 AM

## 2017-02-14 NOTE — Plan of Care (Signed)
Problem: Health Behavior/Discharge Planning: Goal: Ability to manage health-related needs will improve Outcome: Not Applicable Date Met: 25/08/71 Unable d/t mental status/ dementia

## 2017-02-14 NOTE — Progress Notes (Signed)
Report called to nurse Marlowe Kays at [facility]. Patient is stable at discharge.

## 2017-02-14 NOTE — Progress Notes (Signed)
Plan for d/c today, d/c summary completed by Dr. Auburn Bilberry.   Faye Ramsay, MD  [facility] Pager [phone]  If 7PM-7AM, please contact night-coverage [email or web address] Password [email or web address]

## 2017-07-04 DEATH — deceased
